# Patient Record
Sex: Female | Born: 1945
Health system: Southern US, Community
[De-identification: ages and names within clinical notes are randomized; demographics above are authoritative.]

## PROBLEM LIST (undated history)

## (undated) DIAGNOSIS — Z460 Encounter for fitting and adjustment of spectacles and contact lenses: Secondary | ICD-10-CM

## (undated) DIAGNOSIS — M549 Dorsalgia, unspecified: Secondary | ICD-10-CM

## (undated) DIAGNOSIS — M4310 Spondylolisthesis, site unspecified: Secondary | ICD-10-CM

## (undated) DIAGNOSIS — M8580 Other specified disorders of bone density and structure, unspecified site: Secondary | ICD-10-CM

## (undated) DIAGNOSIS — S32402S Unspecified fracture of left acetabulum, sequela: Secondary | ICD-10-CM

## (undated) DIAGNOSIS — I4510 Unspecified right bundle-branch block: Secondary | ICD-10-CM

## (undated) HISTORY — DX: Dorsalgia, unspecified: M54.9

## (undated) HISTORY — DX: Unspecified fracture of left acetabulum, sequela: S32.402S

## (undated) HISTORY — PX: COLONOSCOPY: SHX174

## (undated) HISTORY — DX: Other specified disorders of bone density and structure, unspecified site: M85.80

## (undated) HISTORY — DX: Spondylolisthesis, site unspecified: M43.10

## (undated) HISTORY — PX: HAND TENDON SURGERY: SHX663

---

## 1996-03-14 HISTORY — PX: PLANTAR FASCIA SURGERY: SHX746

## 1998-02-11 ENCOUNTER — Other Ambulatory Visit: Admission: RE | Admit: 1998-02-11 | Discharge: 1998-02-11 | Payer: Self-pay | Admitting: Gynecology

## 1999-02-23 ENCOUNTER — Other Ambulatory Visit: Admission: RE | Admit: 1999-02-23 | Discharge: 1999-02-23 | Payer: Self-pay | Admitting: Gynecology

## 2000-02-24 ENCOUNTER — Other Ambulatory Visit: Admission: RE | Admit: 2000-02-24 | Discharge: 2000-02-24 | Payer: Self-pay | Admitting: Gynecology

## 2001-02-28 ENCOUNTER — Other Ambulatory Visit: Admission: RE | Admit: 2001-02-28 | Discharge: 2001-02-28 | Payer: Self-pay | Admitting: Gynecology

## 2002-02-28 ENCOUNTER — Other Ambulatory Visit: Admission: RE | Admit: 2002-02-28 | Discharge: 2002-02-28 | Payer: Self-pay | Admitting: Gynecology

## 2003-03-20 ENCOUNTER — Other Ambulatory Visit: Admission: RE | Admit: 2003-03-20 | Discharge: 2003-03-20 | Payer: Self-pay | Admitting: Gynecology

## 2004-03-14 HISTORY — PX: OTHER SURGICAL HISTORY: SHX169

## 2004-03-24 ENCOUNTER — Other Ambulatory Visit: Admission: RE | Admit: 2004-03-24 | Discharge: 2004-03-24 | Payer: Self-pay | Admitting: Gynecology

## 2005-03-30 ENCOUNTER — Other Ambulatory Visit: Admission: RE | Admit: 2005-03-30 | Discharge: 2005-03-30 | Payer: Self-pay | Admitting: Gynecology

## 2006-06-15 ENCOUNTER — Other Ambulatory Visit: Admission: RE | Admit: 2006-06-15 | Discharge: 2006-06-15 | Payer: Self-pay | Admitting: Gynecology

## 2007-06-21 ENCOUNTER — Other Ambulatory Visit: Admission: RE | Admit: 2007-06-21 | Discharge: 2007-06-21 | Payer: Self-pay | Admitting: Gynecology

## 2007-09-20 ENCOUNTER — Encounter: Admission: RE | Admit: 2007-09-20 | Discharge: 2007-09-20 | Payer: Self-pay | Admitting: Internal Medicine

## 2008-07-03 ENCOUNTER — Other Ambulatory Visit: Admission: RE | Admit: 2008-07-03 | Discharge: 2008-07-03 | Payer: Self-pay | Admitting: Gynecology

## 2008-07-03 ENCOUNTER — Ambulatory Visit: Payer: Self-pay | Admitting: Women's Health

## 2008-07-03 ENCOUNTER — Encounter: Payer: Self-pay | Admitting: Women's Health

## 2008-10-07 ENCOUNTER — Ambulatory Visit: Payer: Self-pay | Admitting: Women's Health

## 2009-07-09 ENCOUNTER — Other Ambulatory Visit: Admission: RE | Admit: 2009-07-09 | Discharge: 2009-07-09 | Payer: Self-pay | Admitting: Gynecology

## 2009-07-09 ENCOUNTER — Ambulatory Visit: Payer: Self-pay | Admitting: Women's Health

## 2009-07-21 ENCOUNTER — Ambulatory Visit: Payer: Self-pay | Admitting: Gynecology

## 2010-03-14 HISTORY — PX: BACK SURGERY: SHX140

## 2010-07-19 ENCOUNTER — Encounter (INDEPENDENT_AMBULATORY_CARE_PROVIDER_SITE_OTHER): Payer: BC Managed Care – PPO | Admitting: Women's Health

## 2010-07-19 DIAGNOSIS — L940 Localized scleroderma [morphea]: Secondary | ICD-10-CM

## 2010-07-19 DIAGNOSIS — N952 Postmenopausal atrophic vaginitis: Secondary | ICD-10-CM

## 2010-07-19 DIAGNOSIS — N951 Menopausal and female climacteric states: Secondary | ICD-10-CM

## 2010-10-14 ENCOUNTER — Other Ambulatory Visit (HOSPITAL_COMMUNITY): Payer: Self-pay | Admitting: Orthopedic Surgery

## 2010-10-14 ENCOUNTER — Ambulatory Visit (HOSPITAL_COMMUNITY)
Admission: RE | Admit: 2010-10-14 | Discharge: 2010-10-14 | Disposition: A | Discharge status: Home / Self Care | Payer: BC Managed Care – PPO | Source: Ambulatory Visit | Attending: Orthopedic Surgery | Admitting: Orthopedic Surgery

## 2010-10-14 ENCOUNTER — Encounter (HOSPITAL_COMMUNITY)
Admission: RE | Admit: 2010-10-14 | Discharge: 2010-10-14 | Disposition: A | Discharge status: Home / Self Care | Payer: BC Managed Care – PPO | Source: Ambulatory Visit | Attending: Orthopedic Surgery | Admitting: Orthopedic Surgery

## 2010-10-14 DIAGNOSIS — M47817 Spondylosis without myelopathy or radiculopathy, lumbosacral region: Secondary | ICD-10-CM | POA: Insufficient documentation

## 2010-10-14 DIAGNOSIS — M4310 Spondylolisthesis, site unspecified: Secondary | ICD-10-CM

## 2010-10-14 DIAGNOSIS — Q762 Congenital spondylolisthesis: Secondary | ICD-10-CM | POA: Insufficient documentation

## 2010-10-14 DIAGNOSIS — Z01812 Encounter for preprocedural laboratory examination: Secondary | ICD-10-CM | POA: Insufficient documentation

## 2010-10-14 DIAGNOSIS — Z01818 Encounter for other preprocedural examination: Secondary | ICD-10-CM | POA: Insufficient documentation

## 2010-10-14 LAB — CBC
MCH: 30.9 pg (ref 26.0–34.0)
Platelets: 224 10*3/uL (ref 150–400)
RBC: 4.24 MIL/uL (ref 3.87–5.11)
WBC: 4.3 10*3/uL (ref 4.0–10.5)

## 2010-10-14 LAB — SURGICAL PCR SCREEN: Staphylococcus aureus: NEGATIVE

## 2010-10-14 LAB — BASIC METABOLIC PANEL
Calcium: 10.4 mg/dL (ref 8.4–10.5)
GFR calc non Af Amer: >60 mL/min (ref >60)
Glucose, Bld: 104 mg/dL — ABNORMAL HIGH (ref 70–99)
Sodium: 141 mEq/L (ref 135–145)

## 2010-10-14 LAB — TYPE AND SCREEN: ABO/RH(D): A POS

## 2010-10-21 ENCOUNTER — Inpatient Hospital Stay (HOSPITAL_COMMUNITY): Payer: BC Managed Care – PPO

## 2010-10-21 ENCOUNTER — Inpatient Hospital Stay (HOSPITAL_COMMUNITY)
Admission: RE | Admit: 2010-10-21 | Discharge: 2010-10-24 | DRG: 756 | Disposition: A | Discharge status: Home / Self Care | Payer: BC Managed Care – PPO | Source: Ambulatory Visit | Attending: Orthopedic Surgery | Admitting: Orthopedic Surgery

## 2010-10-21 DIAGNOSIS — M4310 Spondylolisthesis, site unspecified: Principal | ICD-10-CM | POA: Diagnosis present

## 2010-10-21 DIAGNOSIS — M48061 Spinal stenosis, lumbar region without neurogenic claudication: Secondary | ICD-10-CM | POA: Diagnosis present

## 2010-10-22 ENCOUNTER — Inpatient Hospital Stay (HOSPITAL_COMMUNITY): Payer: BC Managed Care – PPO

## 2010-10-23 NOTE — Op Note (Signed)
NAMENINI, Lauren Francis NO.:  [id number]  MEDICAL RECORD NO.:  [id number]  LOCATION:  5015                         FACILITY:  [facility]  PHYSICIAN:  Alvy Beal, MD    DATE OF BIRTH:  11/23/1945  DATE OF PROCEDURE:  10/21/2010 DATE OF DISCHARGE:                              OPERATIVE REPORT   PREOPERATIVE DIAGNOSES:  Degenerative spondylolisthesis, significant stenosis L3-4.  POSTOPERATIVE DIAGNOSIS:  Degenerative spondylolisthesis, significant stenosis L3-4.  OPERATIVE PROCEDURE:  Anterolateral diskectomy and interbody fusion with implantation of biomechanical device, and the system used was NuVasive size 12 lordotic x 18 cage 55 mm in length with supplemental posterior instrumentation L3-4 with Matrix Synthes pedicle screws and posterolateral autograft fusion.  FIRST ASSISTANT:  Norval Gable, PA  COMPLICATIONS:  None.  CONDITION:  Stable.  ESTIMATED BLOOD LOSS:  Minimal blood loss.  HISTORY:  This is a very pleasant 65 year old with degenerative scoliosis with significant spinal degenerative spondylolisthesis at L3- 4.  Attempts at conservative management have failed to alleviate her symptoms, and she continued to have significant disabling pain.  After discussing treatment options, we elected to proceed with surgery.  All appropriate risks, benefits, and alternatives were discussed with the patient.  Consent was obtained.  OPERATIVE NOTE:  The patient was brought to the operating room, placed supine on the operating table.  After successful induction of general anesthesia and endotracheal intubation, TEDs, SCDs, and Foley were inserted, lower extremity EMG needles were placed for neuro monitoring. She was then placed into the left lateral decubitus position.  Axillary roll was placed.  All bony prominences well padded, and she was securely taped to the bed.  The bed was broken, and the x-ray was brought in and we identified the L3-4 level.  Once  we had good visualization of the endplates of the AP and lateral planes, the lateral aspect of the back was prepped, draped in standard fashion.  Appropriate time-out was then done to confirm patient procedure and all other pertinent important data.  Once this was done, a lateral incision was made centered over the L3-4 disk.  Sharp dissection was carried out down to the fascia of the external oblique.  Fascia was incised.  A second small incision was made one fingerbreadth posterior to this lateral incision.  I bluntly dissected through the deep lumbar fascia to the fascia of the retroperitoneal space.  I popped through this space, and I was able to palpate the surface of the iliopsoas, the ventral surface of the transverse process, and I began mobilizing the retroperitoneal fat to better it.  Once I had mobilized this area, I then passed my finger up to the external oblique, bluntly dissected through the external oblique until I could see the fingertip.  I then advanced trocar by placing my finger down to the iliopsoas.  I then probed the psoas to identify the location of lumbar plexus and then placed my probe directly over the L3- 4 disk space.  I then advanced it through the iliopsoas.  Using the neuro monitoring, I circumferentially probed the disk space to ensure that I was not damaging the lumbar plexus.  I then sequentially dilated up and again checking neuro monitoring throughout.  Once I had the trocars advanced, I placed the working dilating system, retractor system, and secured it to the table.  X-rays confirmed satisfactory position.  I then opened it so I could clearly see the disk space.  I then used a trocar to secure it into place.  Once the retractor was secured, I began the diskectomy.  I incised the disk space using a combination of pituitary rongeurs and Kerrison rongeurs.  I removed all the disk material.  I had a very extensive diskectomy.  I rasped the endplates  until I felt and heard the subchondral bone.  Inspection revealed that I had bleeding subchondral bone.  I then sequentially trialed and elected to use a size 12 55 lordotic cages.  It gave excellent correction in both the coronal and sagittal planes.  I obtained a PEEK spacer, packed it with a combination of DBX and Osteocel.  Once it was packed, I malleted it down to the final position, which was very satisfactory.  I then removed the inserting device and gently began removing the retractor all the while evaluating the iliopsoas for hemorrhage.  I obtained hemostasis, irrigated the wound copiously with normal saline, and then closed the fascia of the external oblique with #1 Vicryl suture, superficial 2-0 Vicryl suture, and 3-0 Monocryl for the skin.  The second stab incision that I had made I irrigated out, closed the deep fascia with a 0 Vicryl, superficial with 2-0 Vicryl suture, and skin with 3-0 Monocryl.  At this point with the interbody fixation complete, I then returned the bed to the neutral position and with the patient still in the lateral position, made a standard Wilsey approach to the left side of the pedicle.  I made an incision, dissected bluntly down, so I could feel the facet complex and transverse process.  I then placed a Jamshidi needle on the entry portion for the L4 pedicle and confirmed satisfactory position in both the AP and lateral planes, connected the Jamshidi to the neuro monitoring and using real-time fluoro and neuro monitoring, advanced the Jamshidi to the pedicle and into the vertebral body.  I repeated this at the L3 level.  I placed guide pins through the Jamshidi and then tapped over the guide pin.  Again, I did hook the tap to the neuro monitoring to ensure that I had no neurodiagnostic evidence of breach nor did I have radiographic evidence of breach.  I then placed the screws 40 mm, 6.0 diameter Synthes Matrix pedicle screws over the guide pin and  down. I then tested both screws and again, there was no evidence of breach. With the pedicle screw fixation complete, I then took a high-speed bur, placed on the lateral aspect of the 3-4 facet complex, decorticated, and then palpated the transverse process of 3-4 and decorticated them.  I then placed a 40-mm rod and locked it down.  I torqued the caps.  At this point, the posterolateral instrumentation was complete.  I then took a trocar packed with the remaining bone graft and passed it down into the posterior lateral gutter.  With the arthrodesis complete, I irrigated, closed the deep fascia with interrupted #1 Vicryl sutures, superficial with 2-0 Vicryl suture and 3-0 Monocryl for the skin.  Dry dressings were applied.  The patient was extubated, transferred to the PACU without incident.  At the end of the case, all needle and sponge counts were correct.  No adverse intraoperative events.     Alvy Beal, MD  DDB/MEDQ  D:  10/21/2010  T:  10/22/2010  Job:  [id number]  Electronically Signed by Venita Lick MD on 10/23/2010 08:01:55 PM

## 2010-10-29 ENCOUNTER — Telehealth: Payer: Self-pay

## 2010-10-29 NOTE — Telephone Encounter (Signed)
PER NANCY VERBALLY. SHE READ THIS FYI NOTE.

## 2010-10-29 NOTE — Telephone Encounter (Signed)
FYI ONLY PT. CALLED EARLIER TODAY BECAUSE SHE WAS HAVING TROUBLE GETTING TOUCH WITH HER PCP DR. WHITE FOR HEMORRHOIDS. HAD BACK SURGERY ABOUT A WEEK AGO. SINCE I CALLED HER BACK HER PCP TOLD HER TO COME IN TODAY. SINCE SHE IS DOING REALLY WELL WITH BACK SURGERY SHE WILL BE ABLE TO SEE THEM THIS P.M. TO GET HELP.

## 2010-11-01 ENCOUNTER — Encounter (INDEPENDENT_AMBULATORY_CARE_PROVIDER_SITE_OTHER): Payer: Self-pay

## 2010-11-01 ENCOUNTER — Ambulatory Visit (INDEPENDENT_AMBULATORY_CARE_PROVIDER_SITE_OTHER): Payer: BC Managed Care – PPO | Admitting: Surgery

## 2010-11-01 VITALS — BP 140/88 | HR 78 | Temp 97.8°F | Ht 62.5 in | Wt 140.6 lb

## 2010-11-01 DIAGNOSIS — K648 Other hemorrhoids: Secondary | ICD-10-CM | POA: Insufficient documentation

## 2010-11-01 NOTE — Patient Instructions (Addendum)
See Hemorrhoid / THD Handouts.  Consider surgery to treat Hemorrhoids.   Stop antibiotic cream & pills

## 2010-11-01 NOTE — Progress Notes (Signed)
Subjective:     Patient ID: Lauren Francis, female   DOB: [birth year], 65 y.o.   MRN: [id number]  HPI  Reason for visit: Thrombosed hemorrhoid status post incision. Consideration of more definitive hemorrhoid surgery.  Patient is a pleasant female with a forty year history of hemorrhoids. Patient claims they have been mild & well controlled with soft bowel movements. It started when she was pregnant with her children. She normally has a bowel movement every other day to every day.  She recently had back surgery. She became constipated. She then had a severe anal pain with a hard BM. She came to her primary care physician. They performed an incision and drainage of a thrombosed hemorrhoid. She comes today for followup to consider more definitive surgery. She has had a colonoscopy that was normal.    Review of Systems  Constitutional: Negative for fever, chills, diaphoresis, appetite change and fatigue.  HENT: Negative for ear pain, sore throat, trouble swallowing, neck pain and ear discharge.   Eyes: Negative for photophobia, discharge and visual disturbance.  Respiratory: Negative for cough, choking, chest tightness and shortness of breath.   Cardiovascular: Negative for chest pain and palpitations.  Gastrointestinal: Positive for anal bleeding and rectal pain. Negative for nausea, vomiting, abdominal pain, diarrhea, constipation and blood in stool.  Genitourinary: Negative for dysuria, frequency and difficulty urinating.  Musculoskeletal: Negative for myalgias and gait problem.  Skin: Negative for color change, pallor and rash.  Neurological: Negative for dizziness, speech difficulty, weakness and numbness.  Hematological: Negative for adenopathy.  Psychiatric/Behavioral: Negative for confusion and agitation. The patient is not nervous/anxious.        Objective:   Physical Exam  Constitutional: She is oriented to person, place, and time. She appears well-developed and  well-nourished. No distress.  HENT:  Head: Normocephalic.  Mouth/Throat: Oropharynx is clear and moist. No oropharyngeal exudate.  Eyes: Conjunctivae and EOM are normal. Pupils are equal, round, and reactive to light. No scleral icterus.  Neck: Normal range of motion. Neck supple. No tracheal deviation present.  Cardiovascular: Normal rate, regular rhythm and intact distal pulses.   Pulmonary/Chest: Effort normal and breath sounds normal. No respiratory distress. She exhibits no tenderness.  Abdominal: Soft. She exhibits no distension and no mass. There is no tenderness. Hernia confirmed negative in the right inguinal area and confirmed negative in the left inguinal area.       Low midline incision well-healed  Genitourinary: Vagina normal. No vaginal discharge found.       Large 3 pile int/ext prolapsed hemorrhoids.  Inner hem tags necrosed.  NST.  No rectal masses.  No fissure/fistula  Musculoskeletal: Normal range of motion. She exhibits no tenderness.  Lymphadenopathy:    She has no cervical adenopathy.       Right: No inguinal adenopathy present.       Left: No inguinal adenopathy present.  Neurological: She is alert and oriented to person, place, and time. No cranial nerve deficit. She exhibits normal muscle tone. Coordination normal.  Skin: Skin is warm and dry. No rash noted. She is not diaphoretic. No erythema.  Psychiatric: She has a normal mood and affect. Her behavior is normal. Judgment and thought content normal.       Assessment:     Large 3-pile hemorrhoids with recent thrombosis & some necrotic tags.  Better post I&D of thrombosed hemorrhoids    Plan:     These are large and chronically prolapsed. I think she would benefit from surgery  to treat them. I do not think it is mandated she truly has been asymptomatic until a recent episode of thrombosis. Even though they are impresses with some central necrosis, she feels much better overall.  If inflammation calms down, she  may be a candidate for THD. However, she may require more standard hemorrhoidectomy. I will have to tailor this since I would prefer avoiding in removing all 3 piles at once to minimize stenosis/scarring  Sh is leaning toward surgery at some point. However she agreed to allow the inflammation to calm down over the next 4-6 weeks before considering this. I gave her my card. I gave her Handouts. She will think about things and let me know if she wishes to proceed.  The anatomy & physiology of the anorectal region was discussed.  The pathophysiology of hemorrhoids and differential diagnosis was discussed.  Natural history risks without surgery was discussed.   I stressed the importance of a bowel regimen to have daily soft bowel movements to minimize progression of disease.  Interventions such as sclerotherapy & banding were discussed.  The patient's symptoms are not adequately controlled by medicines and other non-operative treatments.  I feel the risks & problems of no surgery outweigh the operative risks; therefore, I recommended surgery to treat the hemorrhoids by ligation, pexy, and possible resection.  Risks such as bleeding, infection, need for further treatment, heart attack, death, and other risks were discussed.  Goals of post-operative recovery were discussed as well.  Possibility that this will not correct all symptoms was explained.  Post-operative pain, bleeding, constipation, and other problems after surgery were discussed.  We will work to minimize complications.   Educational handouts further explaining the pathology, treatment options, and bowel regimen were given as well.  Questions were answered.  The patient expresses understanding & wishes to proceed with surgery.

## 2010-11-12 NOTE — Discharge Summary (Signed)
[name], [name] NO.:  [id number]  MEDICAL RECORD NO.:  [id number]  LOCATION:                                 FACILITY:  PHYSICIAN:  Lauren Beal, MD    DATE OF BIRTH:  1945-07-31  DATE OF ADMISSION:  10/21/2010 DATE OF DISCHARGE:  10/24/2010                              DISCHARGE SUMMARY   ADMITTING DIAGNOSIS:  Degenerative spondylolisthesis with significant stenosis at L3-4.  DISCHARGE DIAGNOSIS:  Degenerative spondylolisthesis with significant stenosis at L3-4.  SURGEON:  Lauren Beal, MD  FIRST ASSISTANT:  Lauren Gable, PA-C  PROCEDURE:  Anterolateral diskectomy and interbody fusion with implantation of biomechanical device with supplemental posterior instrumentation at L3-4 with pedicle screws and posterior lateral autograft fusion.  ANESTHESIA:  General.  COMPLICATIONS:  None.  CONSULTS:  None.  HISTORY:  Lauren Francis is a 65 year old female with degenerative scoliosis with significant spinal degenerative spondylolisthesis at L3-4 with significant back, buttock and lower extremity pain.  Unfortunately, her attempts at physical therapy, narcotic and nonnarcotic medication therapy, activity modification, injection therapy failed to provide her with any significant relief.  MRI of the lumbar spine dated Aug 10, 2010, demonstrated: 1. Transitional vertebral anatomy at the lumbosacral junction. 2. Tiny faint annular disk tear at L2-3. 3. A grade 1 anterolisthesis at L3-4 related to severe facet     arthrosis.  Moderate central canal, moderate to severe lateral     recess, moderate to severe right foraminal and moderate left     foraminal stenosis at L3-4.  Tiny annular disk tear with mild     foraminal stenosis at L4-5.  Chomping of the nerve root at L2 and     L3 level likely related to L3-4 central canal stenosis or less     likely arachnoiditis.  Because of the ongoing severe disabling, back buttock and leg pain, the patient  elected to proceed with surgery.  HOSPITAL COURSE:  On October 21, 2010, the patient was admitted to [facility] and underwent the above procedures without complications. The patient's condition following the procedures were stable.  She was transferred to the PACU and subsequently to the orthopedic floor.  On postop day 1, the patient was doing well with no major complaints. She is afebrile.  Her vital signs were stable.  Her neurovascular status was intact.  There was 2+ dorsalis pedis and posterior tibial pulses. There is no shortness of breath or chest pain.  The abdomen was soft and nontender.  The plan at that time was to continue with care.  She would be mobile with physical therapy and occupational therapy.  We would advance her diet as tolerated.  She was also receive x-rays later that day.  On postop day 2, the patient was doing well.  Her pain was well controlled with oral pain medications.  She reports being out of bed with physical therapy, voiding independently, however, she had not yet had a bowel movement.  Her dressings were changed.  The incision was clean, dry and intact.  Her neurovascular status in bilateral lower extremities was intact.  She was continued to get daily dressing changes.  Continue with physical therapy and occupational therapy until cleared.  On postop day 3, the patient complained of only mild pain.  She was afebrile.  She was voiding on her own.  Tolerating a regular diet.  Her pain was adequately controlled with oral pain medications and she had been cleared by physical therapy and occupational therapy and would be discharged to home.  DISCHARGE MEDICATIONS:  The patient would continue her home medications which include: 1. Ibuprofen 200 mg. 2. Omega red 300 mg. 3. Enteric-coated aspirin 81 mg. 4. Vitamin D 3000 units. 5. Calcium carbonate/vitamin D over-the-counter. 6. Multivitamins. 7. Atorvastatin 20 mg. 8. Synthroid 100  mcg.  NEW MEDICATION AT THE TIME OF DISCHARGE:  Percocet and Robaxin.  PLAN AT THE TIME OF DISCHARGE:  The patient would be discharged to home with the assistance of a Home Health Service on October 24, 2010.  DISCHARGE DIAGNOSIS:  Please see above.  DISCHARGE INSTRUCTIONS:  Were given to the patient at the time of discharge.  She understands.  INSTRUCTIONS:  Regular diet.  ACTIVITY:  Per protocol and to follow up in our office in 2 weeks for suture removal, wound check and further discussion of the expected recovery course.  All of the patient's questions were encouraged, addressed and answered. They know to contact our office if problems arise before her scheduled postop visit.  CONDITION AT THE TIME OF DISCHARGE:  Considered stable.    ______________________________ Lauren Gable, PA   ______________________________ Lauren Beal, MD    DK/MEDQ  D:  11/04/2010  T:  11/05/2010  Job:  [id number]  Electronically Signed by Lauren Gable PA on 11/11/2010 09:34:00 AM Electronically Signed by Venita Lick MD on 11/12/2010 03:50:16 PM

## 2010-11-29 ENCOUNTER — Encounter: Payer: Self-pay | Admitting: Women's Health

## 2011-05-16 ENCOUNTER — Telehealth (INDEPENDENT_AMBULATORY_CARE_PROVIDER_SITE_OTHER): Payer: Self-pay | Admitting: Surgery

## 2011-05-17 ENCOUNTER — Telehealth (INDEPENDENT_AMBULATORY_CARE_PROVIDER_SITE_OTHER): Payer: Self-pay

## 2011-05-17 NOTE — Telephone Encounter (Signed)
Returned pt's call about needing an appt with Dr Michaell Cowing for March. I was going to make an appt for 3-25 for the pt to see Dr Michaell Cowing but the pt told me she needed to see Dr Michaell Cowing in March to also have her surgery before 06-13-11. I explained to the pt that this would not happen b/c Dr Michaell Cowing has been out of the office and his schedule is full till 06-06-11. The surgery is an elective case that would not be an emergency b/c her insurance is changing. I advised the pt to call us when she is ready to schedule an appt with Dr Michaell Cowing but to give [place] some time also to be able to schedule the surgery for a day she would request. The pt understands and will call back when she meets her deductible again.

## 2011-08-01 ENCOUNTER — Encounter: Payer: BC Managed Care – PPO | Admitting: Women's Health

## 2011-08-15 ENCOUNTER — Other Ambulatory Visit (HOSPITAL_COMMUNITY)
Admission: RE | Admit: 2011-08-15 | Discharge: 2011-08-15 | Disposition: A | Discharge status: Home / Self Care | Payer: BC Managed Care – PPO | Source: Ambulatory Visit | Attending: Obstetrics and Gynecology | Admitting: Obstetrics and Gynecology

## 2011-08-15 ENCOUNTER — Encounter: Payer: Self-pay | Admitting: Women's Health

## 2011-08-15 ENCOUNTER — Ambulatory Visit (INDEPENDENT_AMBULATORY_CARE_PROVIDER_SITE_OTHER): Payer: BC Managed Care – PPO | Admitting: Women's Health

## 2011-08-15 VITALS — BP 130/80 | Ht 63.5 in | Wt 146.0 lb

## 2011-08-15 DIAGNOSIS — M949 Disorder of cartilage, unspecified: Secondary | ICD-10-CM

## 2011-08-15 DIAGNOSIS — Z124 Encounter for screening for malignant neoplasm of cervix: Secondary | ICD-10-CM | POA: Insufficient documentation

## 2011-08-15 DIAGNOSIS — F411 Generalized anxiety disorder: Secondary | ICD-10-CM

## 2011-08-15 DIAGNOSIS — E039 Hypothyroidism, unspecified: Secondary | ICD-10-CM | POA: Insufficient documentation

## 2011-08-15 DIAGNOSIS — M8580 Other specified disorders of bone density and structure, unspecified site: Secondary | ICD-10-CM | POA: Insufficient documentation

## 2011-08-15 DIAGNOSIS — Z01419 Encounter for gynecological examination (general) (routine) without abnormal findings: Secondary | ICD-10-CM

## 2011-08-15 DIAGNOSIS — E7800 Pure hypercholesterolemia, unspecified: Secondary | ICD-10-CM | POA: Insufficient documentation

## 2011-08-15 DIAGNOSIS — F419 Anxiety disorder, unspecified: Secondary | ICD-10-CM

## 2011-08-15 MED ORDER — ALPRAZOLAM 0.25 MG PO TABS: 0.2500 mg | ORAL_TABLET | ORAL | Status: DC | PRN

## 2011-08-15 NOTE — Progress Notes (Signed)
Lauren Francis 08/01/1955 [id number]    History:    The patient presents for annual exam.  Postmenopausal on no HRT with no bleeding. Hypothyroid on 100 mcg per primary care. History of osteopenia last DEXA 07/2010 T score -1.5 at right femoral neck. FRAX score for major osteoporotic fracture 15.6%, hip fracture 1.9%. Has had a normal vitamin D level. Is exercising on a regular basis. History of normal mammograms and Paps. Had back surgery for spondylosis in 2012 and is doing well.  Past medical history, past surgical history, family history and social history were all reviewed and documented in the EPIC chart. Normal colonoscopy in 2008. Zostovac and Pneumovax vaccine 2011. Working part time and plans to retire at the end of the year. Granddaughter Lauren Francis age 19 with spina bifida doing well with in and out catheters and ostomy irrigations.  A  ROS was performed and pertinent positives and negatives are included in the history.  Exam:  Filed Vitals:   08/15/11 0843  BP: 130/80    General appearance:  Normal Head/Neck:  Normal, without cervical or supraclavicular adenopathy. Thyroid:  Symmetrical, normal in size, without palpable masses or nodularity. Respiratory  Effort:  Normal  Auscultation:  Clear without wheezing or rhonchi Cardiovascular  Auscultation:  Regular rate, without rubs, murmurs or gallops  Edema/varicosities:  Not grossly evident Abdominal  Soft,nontender, without masses, guarding or rebound.  Liver/spleen:  No organomegaly noted  Hernia:  None appreciated  Skin  Inspection:  Grossly normal  Palpation:  Grossly normal Neurologic/psychiatric  Orientation:  Normal with appropriate conversation.  Mood/affect:  Normal  Genitourinary    Breasts: Examined lying and sitting.     Right: Without masses, retractions, discharge or axillary adenopathy.     Left: Without masses, retractions, discharge or axillary adenopathy.   Inguinal/mons:  Normal without inguinal  adenopathy  External genitalia:  Normal  BUS/Urethra/Skene's glands:  Normal  Bladder:  Normal  Vagina:  Normal  Cervix:  Normal  Uterus:   normal in size, shape and contour.  Midline and mobile  Adnexa/parametria:     Rt: Without masses or tenderness.   Lt: Without masses or tenderness.  Anus and perineum: Normal  Digital rectal exam: Normal sphincter tone with hemorrhoids  Assessment/Plan:  66 y.o. MWF G2P2 for annual exam with no complaints.  Normal postmenopausal exam Osteopenia T score -1.5 femoral neck-5/12 Hypothyroid, hypercholesteremia-primary care/Dr. Cliffton Asters- labs and   Plan: Pap, New screening recommendations reviewed, normal Pap in 2011, reviewed if normal will need no further testing. SBE's, continue annual mammogram, calcium rich diet, vitamin D 2000 daily and continue regular exercise encouraged. Home safety and fall prevention reviewed. Home Hemoccult cards given. Prescription for Xanax 0.25 to use when necessary #30 per request.  Is aware to use sparingly, aware it is addictive.    Harrington Challenger [facility], 12:55 PM 08/15/2011

## 2011-08-15 NOTE — Patient Instructions (Signed)

## 2011-09-13 ENCOUNTER — Other Ambulatory Visit: Payer: Self-pay | Admitting: *Deleted

## 2011-09-13 DIAGNOSIS — Z1211 Encounter for screening for malignant neoplasm of colon: Secondary | ICD-10-CM

## 2011-09-14 ENCOUNTER — Other Ambulatory Visit: Payer: Self-pay | Admitting: Women's Health

## 2011-09-14 DIAGNOSIS — Z1211 Encounter for screening for malignant neoplasm of colon: Secondary | ICD-10-CM

## 2012-05-01 ENCOUNTER — Encounter: Payer: Self-pay | Admitting: Gynecology

## 2012-09-11 ENCOUNTER — Other Ambulatory Visit: Payer: Self-pay | Admitting: Family Medicine

## 2012-09-11 DIAGNOSIS — R0989 Other specified symptoms and signs involving the circulatory and respiratory systems: Secondary | ICD-10-CM

## 2012-09-25 ENCOUNTER — Ambulatory Visit
Admission: RE | Admit: 2012-09-25 | Discharge: 2012-09-25 | Disposition: A | Discharge status: Home / Self Care | Payer: Medicare Other | Source: Ambulatory Visit | Attending: Family Medicine | Admitting: Family Medicine

## 2012-09-25 DIAGNOSIS — R0989 Other specified symptoms and signs involving the circulatory and respiratory systems: Secondary | ICD-10-CM

## 2012-09-27 ENCOUNTER — Ambulatory Visit (INDEPENDENT_AMBULATORY_CARE_PROVIDER_SITE_OTHER): Payer: Medicare Other | Admitting: Women's Health

## 2012-09-27 ENCOUNTER — Encounter: Payer: Self-pay | Admitting: Women's Health

## 2012-09-27 VITALS — BP 130/78 | Ht 63.0 in | Wt 144.0 lb

## 2012-09-27 DIAGNOSIS — F419 Anxiety disorder, unspecified: Secondary | ICD-10-CM

## 2012-09-27 DIAGNOSIS — M899 Disorder of bone, unspecified: Secondary | ICD-10-CM

## 2012-09-27 DIAGNOSIS — M8580 Other specified disorders of bone density and structure, unspecified site: Secondary | ICD-10-CM

## 2012-09-27 DIAGNOSIS — F411 Generalized anxiety disorder: Secondary | ICD-10-CM

## 2012-09-27 MED ORDER — ALPRAZOLAM 0.25 MG PO TABS: 0.2500 mg | ORAL_TABLET | ORAL | Status: DC | PRN

## 2012-09-27 NOTE — Progress Notes (Signed)
Lauren Francis 1945/07/02 [id number]    History:    The patient presents for Breast and pelvic exam. Postmenopausal on no HRT with no bleeding. Hypothyroid/hypercholesterolemia-primary care manages labs and meds. Osteopenic, DEXA 2012 T score -1.5 at right femoral neck, FRAX 15.6%/1.9%. Normal Pap and mammogram history.  Negative colonoscopy 2008. Has had Pneumovax and zostavac vaccines.  Past medical history, past surgical history, family history and social history were all reviewed and documented in the EPIC chart. Retired this past year. Had a spinal fusion years ago. but has been good relief of back pain. Lauren Francis 8 doing well with spina bifida, self catheter and colostomy.   ROS:  A  ROS was performed and pertinent positives and negatives are included in the history.  Exam:  Filed Vitals:   09/27/12 0907  BP: 130/78    General appearance:  Normal Head/Neck:  Normal, without cervical or supraclavicular adenopathy. Thyroid:  Symmetrical, normal in size, without palpable masses or nodularity. Respiratory  Effort:  Normal  Auscultation:  Clear without wheezing or rhonchi Cardiovascular  Auscultation:  Regular rate, without rubs, murmurs or gallops  Edema/varicosities:  Not grossly evident Abdominal  Soft,nontender, without masses, guarding or rebound.  Liver/spleen:  No organomegaly noted  Hernia:  None appreciated  Skin  Inspection:  Grossly normal  Palpation:  Grossly normal Neurologic/psychiatric  Orientation:  Normal with appropriate conversation.  Mood/affect:  Normal  Genitourinary    Breasts: Examined lying and sitting.     Right: Without masses, retractions, discharge or axillary adenopathy.     Left: Without masses, retractions, discharge or axillary adenopathy.   Inguinal/mons:  Normal without inguinal adenopathy  External genitalia:  Normal  BUS/Urethra/Skene's glands:  Normal  Bladder:  Normal  Vagina:  Normal  Cervix:  Normal  Uterus: normal in size,  shape and contour.  Midline and mobile  Adnexa/parametria:     Rt: Without masses or tenderness.   Lt: Without masses or tenderness.  Anus and perineum: hemorrhoids  Digital rectal exam: Normal sphincter tone without palpated masses or tenderness  Assessment/Plan:  67 y.o.M. WF G2 P2 for breast and pelvic exam.  Osteopenia Hypothyroid/hypercholesterolemia-primary care labs and meds  Plan: SBE's, continue annual mammogram, calcium rich diet, vitamin D 2000 daily, continue regular exercise encouraged. DEXA, will schedule. Home safety, fall prevention and importance of regular exercise discussed. Pap normal 2013, new screening guidelines reviewed. Home Hemoccult card given with instructions.      Lauren Francis Lauren Francis, 10:47 AM 09/27/2012

## 2012-09-27 NOTE — Patient Instructions (Signed)
Health Recommendations for Postmenopausal Women Respected and ongoing research has looked at the most common causes of death, disability, and poor quality of life in postmenopausal women. The causes include heart disease, diseases of blood vessels, diabetes, depression, cancer, and bone loss (osteoporosis). Many things can be done to help lower the chances of developing these and other common problems: CARDIOVASCULAR DISEASE Heart Disease: A heart attack is a medical emergency. Know the signs and symptoms of a heart attack. Below are things women can do to reduce their risk for heart disease.   Do not smoke. If you smoke, quit.  Aim for a healthy weight. Being overweight causes many preventable deaths. Eat a healthy and balanced diet and drink an adequate amount of liquids.  Get moving. Make a commitment to be more physically active. Aim for 30 minutes of activity on most, if not all days of the week.  Eat for heart health. Choose a diet that is low in saturated fat and cholesterol and eliminate trans fat. Include whole grains, vegetables, and fruits. Read and understand the labels on food containers before buying.  Know your numbers. Ask your caregiver to check your blood pressure, cholesterol (total, HDL, LDL, triglycerides) and blood glucose. Work with your caregiver on improving your entire clinical picture.  High blood pressure. Limit or stop your table salt intake (try salt substitute and food seasonings). Avoid salty foods and drinks. Read labels on food containers before buying. Eating well and exercising can help control high blood pressure. STROKE  Stroke is a medical emergency. Stroke may be the result of a blood clot in a blood vessel in the brain or by a brain hemorrhage (bleeding). Know the signs and symptoms of a stroke. To lower the risk of developing a stroke:  Avoid fatty foods.  Quit smoking.  Control your diabetes, blood pressure, and irregular heart rate. THROMBOPHLEBITIS  (BLOOD CLOT) OF THE LEG  Becoming overweight and leading a stationary lifestyle may also contribute to developing blood clots. Controlling your diet and exercising will help lower the risk of developing blood clots. CANCER SCREENING  Breast Cancer: Take steps to reduce your risk of breast cancer.  You should practice "breast self-awareness." This means understanding the normal appearance and feel of your breasts and should include breast self-examination. Any changes detected, no matter how small, should be reported to your caregiver.  After age 40, you should have a clinical breast exam (CBE) every year.  Starting at age 40, you should consider having a mammogram (breast X-ray) every year.  If you have a family history of breast cancer, talk to your caregiver about genetic screening.  If you are at high risk for breast cancer, talk to your caregiver about having an MRI and a mammogram every year.  Intestinal or Stomach Cancer: Tests to consider are a rectal exam, fecal occult blood, sigmoidoscopy, and colonoscopy. Women who are high risk may need to be screened at an earlier age and more often.  Cervical Cancer:  Beginning at age 30, you should have a Pap test every 3 years as long as the past 3 Pap tests have been normal.  If you have had past treatment for cervical cancer or a condition that could lead to cancer, you need Pap tests and screening for cancer for at least 20 years after your treatment.  If you had a hysterectomy for a problem that was not cancer or a condition that could lead to cancer, then you no longer need Pap tests.    If you are between ages 65 and 70, and you have had normal Pap tests going back 10 years, you no longer need Pap tests.  If Pap tests have been discontinued, risk factors (such as a new sexual partner) need to be reassessed to determine if screening should be resumed.  Some medical problems can increase the chance of getting cervical cancer. In these  cases, your caregiver may recommend more frequent screening and Pap tests.  Uterine Cancer: If you have vaginal bleeding after reaching menopause, you should notify your caregiver.  Ovarian cancer: Other than yearly pelvic exams, there are no reliable tests available to screen for ovarian cancer at this time except for yearly pelvic exams.  Lung Cancer: Yearly chest X-rays can detect lung cancer and should be done on high risk women, such as cigarette smokers and women with chronic lung disease (emphysema).  Skin Cancer: A complete body skin exam should be done at your yearly examination. Avoid overexposure to the sun and ultraviolet light lamps. Use a strong sun block cream when in the sun. All of these things are important in lowering the risk of skin cancer. MENOPAUSE Menopause Symptoms: Hormone therapy products are effective for treating symptoms associated with menopause:  Moderate to severe hot flashes.  Night sweats.  Mood swings.  Headaches.  Tiredness.  Loss of sex drive.  Insomnia.  Other symptoms. Hormone replacement carries certain risks, especially in older women. Women who use or are thinking about using estrogen or estrogen with progestin treatments should discuss that with their caregiver. Your caregiver will help you understand the benefits and risks. The ideal dose of hormone replacement therapy is not known. The Food and Drug Administration (FDA) has concluded that hormone therapy should be used only at the lowest doses and for the shortest amount of time to reach treatment goals.  OSTEOPOROSIS Protecting Against Bone Loss and Preventing Fracture: If you use hormone therapy for prevention of bone loss (osteoporosis), the risks for bone loss must outweigh the risk of the therapy. Ask your caregiver about other medications known to be safe and effective for preventing bone loss and fractures. To guard against bone loss or fractures, the following is recommended:  If  you are less than age 50, take 1000 mg of calcium and at least 600 mg of Vitamin D per day.  If you are greater than age 50 but less than age 70, take 1200 mg of calcium and at least 600 mg of Vitamin D per day.  If you are greater than age 70, take 1200 mg of calcium and at least 800 mg of Vitamin D per day. Smoking and excessive alcohol intake increases the risk of osteoporosis. Eat foods rich in calcium and vitamin D and do weight bearing exercises several times a week as your caregiver suggests. DIABETES Diabetes Melitus: If you have Type I or Type 2 diabetes, you should keep your blood sugar under control with diet, exercise and recommended medication. Avoid too many sweets, starchy and fatty foods. Being overweight can make control more difficult. COGNITION AND MEMORY Cognition and Memory: Menopausal hormone therapy is not recommended for the prevention of cognitive disorders such as Alzheimer's disease or memory loss.  DEPRESSION  Depression may occur at any age, but is common in elderly women. The reasons may be because of physical, medical, social (loneliness), or financial problems and needs. If you are experiencing depression because of medical problems and control of symptoms, talk to your caregiver about this. Physical activity and   exercise may help with mood and sleep. Community and volunteer involvement may help your sense of value and worth. If you have depression and you feel that the problem is getting worse or becoming severe, talk to your caregiver about treatment options that are best for you. ACCIDENTS  Accidents are common and can be serious in the elderly woman. Prepare your house to prevent accidents. Eliminate throw rugs, place hand bars in the bath, shower and toilet areas. Avoid wearing high heeled shoes or walking on wet, snowy, and icy areas. Limit or stop driving if you have vision or hearing problems, or you feel you are unsteady with you movements and  reflexes. HEPATITIS C Hepatitis C is a type of viral infection affecting the liver. It is spread mainly through contact with blood from an infected person. It can be treated, but if left untreated, it can lead to severe liver damage over years. Many people who are infected do not know that the virus is in their blood. If you are a "baby-boomer", it is recommended that you have one screening test for Hepatitis C. IMMUNIZATIONS  Several immunizations are important to consider having during your senior years, including:   Tetanus, diptheria, and pertussis booster shot.  Influenza every year before the flu season begins.  Pneumonia vaccine.  Shingles vaccine.  Others as indicated based on your specific needs. Talk to your caregiver about these. Document Released: 04/22/2005 Document Revised: 02/15/2012 Document Reviewed: 12/17/2007 [employer] Patient Information 2014 [employer].  

## 2012-10-11 ENCOUNTER — Encounter: Payer: Self-pay | Admitting: Gynecology

## 2012-10-17 ENCOUNTER — Other Ambulatory Visit: Payer: Self-pay

## 2012-11-14 DIAGNOSIS — Z1211 Encounter for screening for malignant neoplasm of colon: Secondary | ICD-10-CM

## 2012-11-15 ENCOUNTER — Other Ambulatory Visit: Payer: Medicare Other | Admitting: Anesthesiology

## 2012-11-15 DIAGNOSIS — Z1211 Encounter for screening for malignant neoplasm of colon: Secondary | ICD-10-CM

## 2012-12-21 ENCOUNTER — Other Ambulatory Visit: Payer: Self-pay | Admitting: Orthopedic Surgery

## 2012-12-24 IMAGING — CR DG LUMBAR SPINE 2-3V
2 series · 2 of 2 positions shown · non-contrast
Comparison: Intraoperative exam intraoperative exam 10/21/2010.
Preoperative examination 10/14/2010.

CLINICAL DATA: Postop pain.

LUMBAR SPINE - 2-3 VIEW

[w l-spine a.p. *]
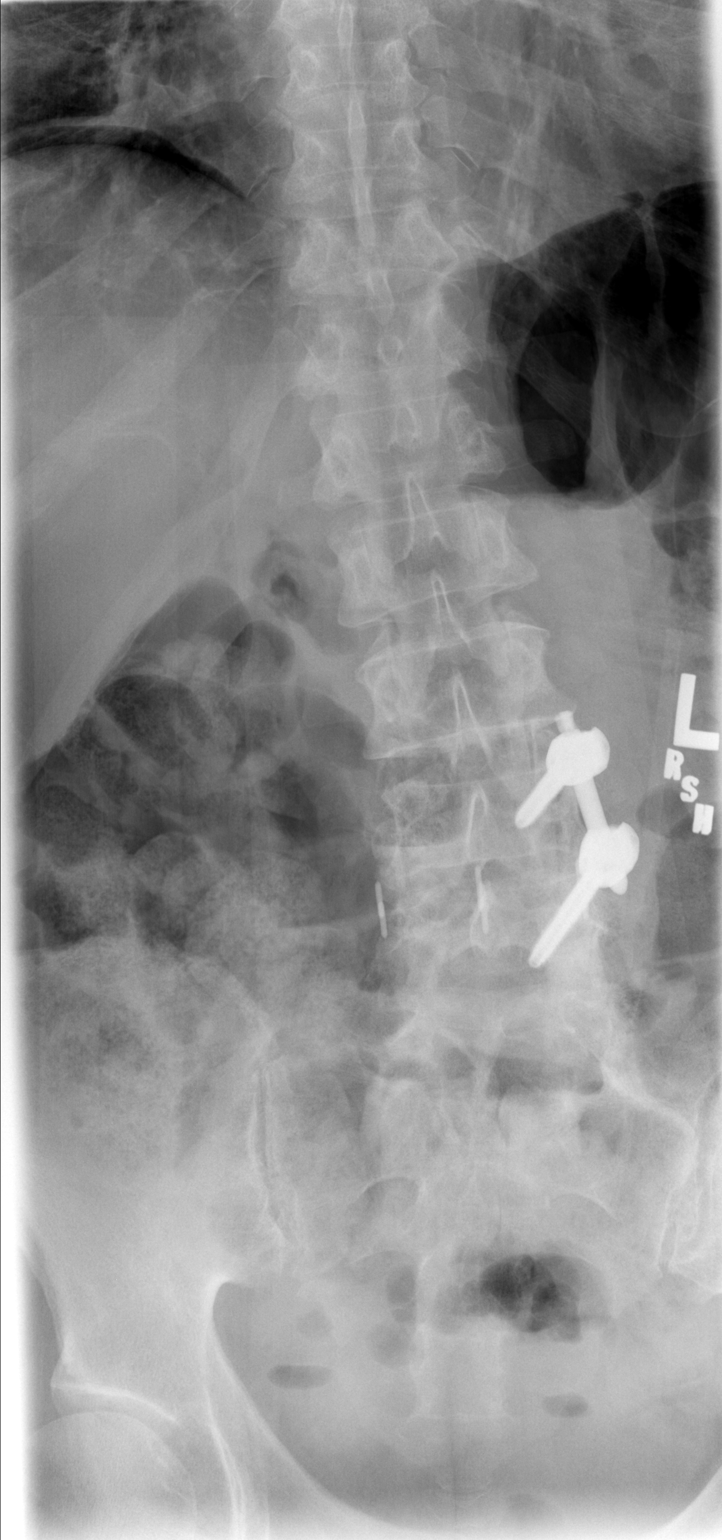

[w l-spine lat]
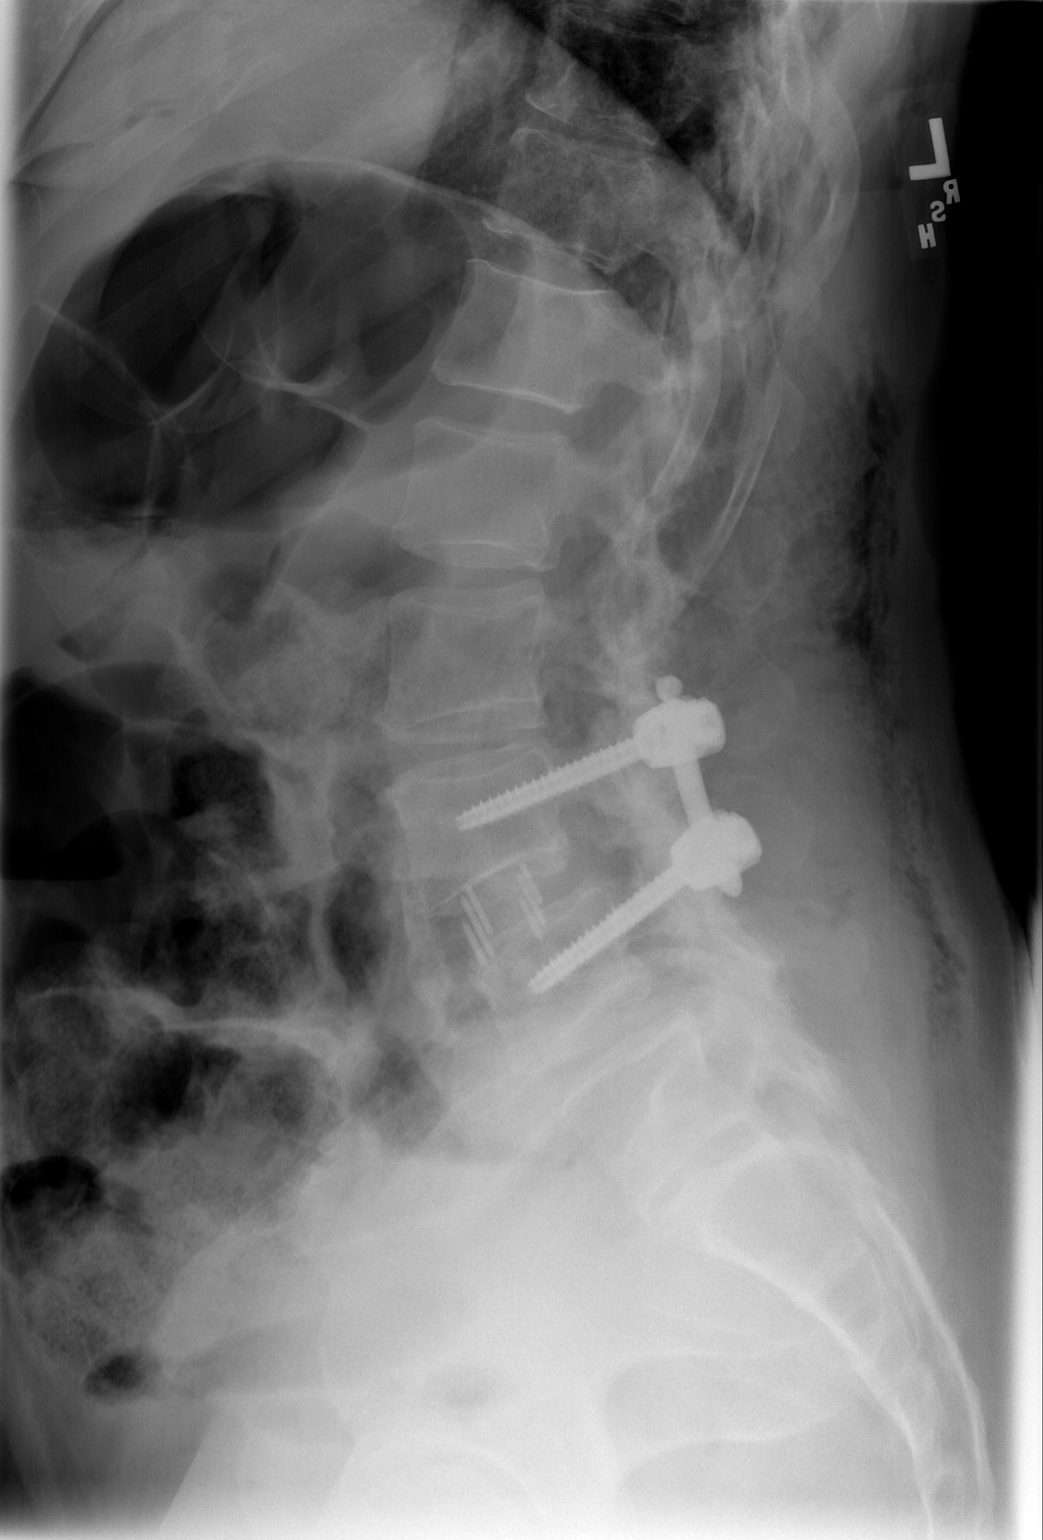

[2 of 2 positions shown; findings below may reference images not displayed]

FINDINGS: Transpedicular fusion left L3-4 level.  Interbody spacer
placement with spacer located at the right lateral aspect of the
disc space.  Mild anterior listhesis of L3 (listhesis noted
preoperatively).

Stool and gas filled prominent sized colon.

Prominent gas dorsal subcutaneous region and T12-L5.

The patient may benefit from CT imaging if symptoms persist to
evaluate for possible postoperative complication not detected by
the present plain film exam.
IMPRESSION: Fusion L3-4.  The interbody spacer is located in a right lateral
position at the outer margin of the disc space. If the patient has
persistent symptoms, CT imaging may be considered.

## 2013-01-01 ENCOUNTER — Encounter (HOSPITAL_BASED_OUTPATIENT_CLINIC_OR_DEPARTMENT_OTHER): Payer: Self-pay | Admitting: *Deleted

## 2013-01-01 NOTE — Progress Notes (Signed)
Pt had a lumb fusion 2012-had a rbbb-saw dr smith-no testing was done-he said it was not needed-did well with surgery-no labs needed

## 2013-01-04 ENCOUNTER — Encounter (HOSPITAL_BASED_OUTPATIENT_CLINIC_OR_DEPARTMENT_OTHER): Payer: Medicare Other | Admitting: Certified Registered Nurse Anesthetist

## 2013-01-04 ENCOUNTER — Encounter (HOSPITAL_BASED_OUTPATIENT_CLINIC_OR_DEPARTMENT_OTHER): Payer: Self-pay | Admitting: Certified Registered Nurse Anesthetist

## 2013-01-04 ENCOUNTER — Ambulatory Visit (HOSPITAL_BASED_OUTPATIENT_CLINIC_OR_DEPARTMENT_OTHER)
Admission: RE | Admit: 2013-01-04 | Discharge: 2013-01-04 | Disposition: A | Discharge status: Home / Self Care | Payer: Medicare Other | Source: Ambulatory Visit | Attending: Orthopedic Surgery | Admitting: Orthopedic Surgery

## 2013-01-04 ENCOUNTER — Encounter (HOSPITAL_BASED_OUTPATIENT_CLINIC_OR_DEPARTMENT_OTHER)
Admission: RE | Disposition: A | Discharge status: Home / Self Care | Payer: Self-pay | Source: Ambulatory Visit | Attending: Orthopedic Surgery

## 2013-01-04 ENCOUNTER — Ambulatory Visit (HOSPITAL_BASED_OUTPATIENT_CLINIC_OR_DEPARTMENT_OTHER): Payer: Medicare Other | Admitting: Certified Registered Nurse Anesthetist

## 2013-01-04 DIAGNOSIS — I4510 Unspecified right bundle-branch block: Secondary | ICD-10-CM | POA: Insufficient documentation

## 2013-01-04 DIAGNOSIS — Z79899 Other long term (current) drug therapy: Secondary | ICD-10-CM | POA: Insufficient documentation

## 2013-01-04 DIAGNOSIS — M899 Disorder of bone, unspecified: Secondary | ICD-10-CM | POA: Insufficient documentation

## 2013-01-04 DIAGNOSIS — G5600 Carpal tunnel syndrome, unspecified upper limb: Secondary | ICD-10-CM | POA: Insufficient documentation

## 2013-01-04 DIAGNOSIS — Z7982 Long term (current) use of aspirin: Secondary | ICD-10-CM | POA: Insufficient documentation

## 2013-01-04 DIAGNOSIS — E039 Hypothyroidism, unspecified: Secondary | ICD-10-CM | POA: Insufficient documentation

## 2013-01-04 DIAGNOSIS — E785 Hyperlipidemia, unspecified: Secondary | ICD-10-CM | POA: Insufficient documentation

## 2013-01-04 HISTORY — PX: CARPAL TUNNEL RELEASE: SHX101

## 2013-01-04 HISTORY — DX: Encounter for fitting and adjustment of spectacles and contact lenses: Z46.0

## 2013-01-04 HISTORY — DX: Unspecified right bundle-branch block: I45.10

## 2013-01-04 SURGERY — CARPAL TUNNEL RELEASE
Anesthesia: Monitor Anesthesia Care | Site: Wrist | Laterality: Right | Wound class: Clean

## 2013-01-04 MED ORDER — ONDANSETRON HCL 4 MG/2ML IJ SOLN: 4.0000 mg | Freq: Once | INTRAMUSCULAR | Status: DC | PRN

## 2013-01-04 MED ORDER — BUPIVACAINE HCL (PF) 0.25 % IJ SOLN
INTRAMUSCULAR | Status: AC
  Filled 2013-01-04: qty 30

## 2013-01-04 MED ORDER — LACTATED RINGERS IV SOLN
INTRAVENOUS | Status: DC
  Administered 2013-01-04: 12:00:00 via INTRAVENOUS

## 2013-01-04 MED ORDER — CHLORHEXIDINE GLUCONATE 4 % EX LIQD: 60.0000 mL | Freq: Once | CUTANEOUS | Status: DC

## 2013-01-04 MED ORDER — HYDROCODONE-ACETAMINOPHEN 5-325 MG PO TABS: 1.0000 | ORAL_TABLET | Freq: Four times a day (QID) | ORAL | Status: DC | PRN

## 2013-01-04 MED ORDER — ONDANSETRON HCL 4 MG/2ML IJ SOLN
INTRAMUSCULAR | Status: DC | PRN
  Administered 2013-01-04: 4 mg via INTRAVENOUS

## 2013-01-04 MED ORDER — PROPOFOL INFUSION 10 MG/ML OPTIME
INTRAVENOUS | Status: DC | PRN
  Administered 2013-01-04: 75 ug/kg/min via INTRAVENOUS

## 2013-01-04 MED ORDER — FENTANYL CITRATE 0.05 MG/ML IJ SOLN
INTRAMUSCULAR | Status: AC
  Filled 2013-01-04: qty 2

## 2013-01-04 MED ORDER — CEFAZOLIN SODIUM-DEXTROSE 2-3 GM-% IV SOLR
2.0000 g | INTRAVENOUS | Status: AC
  Administered 2013-01-04: 2 g via INTRAVENOUS

## 2013-01-04 MED ORDER — OXYCODONE HCL 5 MG/5ML PO SOLN: 5.0000 mg | Freq: Once | ORAL | Status: DC | PRN

## 2013-01-04 MED ORDER — OXYCODONE HCL 5 MG PO TABS: 5.0000 mg | ORAL_TABLET | Freq: Once | ORAL | Status: DC | PRN

## 2013-01-04 MED ORDER — CEFAZOLIN SODIUM 1-5 GM-% IV SOLN
INTRAVENOUS | Status: AC
  Filled 2013-01-04: qty 100

## 2013-01-04 MED ORDER — BUPIVACAINE HCL (PF) 0.25 % IJ SOLN
INTRAMUSCULAR | Status: DC | PRN
  Administered 2013-01-04: 6 mL

## 2013-01-04 MED ORDER — FENTANYL CITRATE 0.05 MG/ML IJ SOLN
INTRAMUSCULAR | Status: DC | PRN
  Administered 2013-01-04: 50 ug via INTRAVENOUS

## 2013-01-04 MED ORDER — LIDOCAINE HCL (CARDIAC) 20 MG/ML IV SOLN
INTRAVENOUS | Status: DC | PRN
  Administered 2013-01-04: 30 mg via INTRAVENOUS

## 2013-01-04 MED ORDER — LIDOCAINE HCL (PF) 0.5 % IJ SOLN
INTRAMUSCULAR | Status: DC | PRN
  Administered 2013-01-04: 30 mL via INTRAVENOUS

## 2013-01-04 MED ORDER — HYDROMORPHONE HCL PF 1 MG/ML IJ SOLN: 0.2500 mg | INTRAMUSCULAR | Status: DC | PRN

## 2013-01-04 SURGICAL SUPPLY — 39 items
BANDAGE GAUZE ELAST BULKY 4 IN (GAUZE/BANDAGES/DRESSINGS) ×2 IMPLANT
BLADE SURG 15 STRL LF DISP TIS (BLADE) ×1 IMPLANT
BLADE SURG 15 STRL SS (BLADE) ×2
BNDG CMPR 9X4 STRL LF SNTH (GAUZE/BANDAGES/DRESSINGS)
BNDG COHESIVE 3X5 TAN STRL LF (GAUZE/BANDAGES/DRESSINGS) ×2 IMPLANT
BNDG ESMARK 4X9 LF (GAUZE/BANDAGES/DRESSINGS) IMPLANT
CHLORAPREP W/TINT 26ML (MISCELLANEOUS) ×2 IMPLANT
CORDS BIPOLAR (ELECTRODE) ×2 IMPLANT
COVER MAYO STAND STRL (DRAPES) ×2 IMPLANT
COVER TABLE BACK 60X90 (DRAPES) ×2 IMPLANT
CUFF TOURNIQUET SINGLE 18IN (TOURNIQUET CUFF) ×2 IMPLANT
DRAPE EXTREMITY T 121X128X90 (DRAPE) ×2 IMPLANT
DRAPE SURG 17X23 STRL (DRAPES) ×2 IMPLANT
DRSG KUZMA FLUFF (GAUZE/BANDAGES/DRESSINGS) ×2 IMPLANT
GAUZE XEROFORM 1X8 LF (GAUZE/BANDAGES/DRESSINGS) ×2 IMPLANT
GLOVE BIO SURGEON STRL SZ 6.5 (GLOVE) ×1 IMPLANT
GLOVE BIOGEL PI IND STRL 7.0 (GLOVE) IMPLANT
GLOVE BIOGEL PI IND STRL 8.5 (GLOVE) ×1 IMPLANT
GLOVE BIOGEL PI INDICATOR 7.0 (GLOVE) ×1
GLOVE BIOGEL PI INDICATOR 8.5 (GLOVE) ×1
GLOVE ECLIPSE 6.5 STRL STRAW (GLOVE) ×1 IMPLANT
GLOVE SURG ORTHO 8.0 STRL STRW (GLOVE) ×2 IMPLANT
GOWN BRE IMP PREV XXLGXLNG (GOWN DISPOSABLE) ×2 IMPLANT
GOWN PREVENTION PLUS XLARGE (GOWN DISPOSABLE) ×2 IMPLANT
NEEDLE 27GAX1X1/2 (NEEDLE) ×1 IMPLANT
NS IRRIG 1000ML POUR BTL (IV SOLUTION) ×2 IMPLANT
PACK BASIN DAY SURGERY FS (CUSTOM PROCEDURE TRAY) ×2 IMPLANT
PAD CAST 3X4 CTTN HI CHSV (CAST SUPPLIES) ×1 IMPLANT
PADDING CAST ABS 4INX4YD NS (CAST SUPPLIES) ×1
PADDING CAST ABS COTTON 4X4 ST (CAST SUPPLIES) ×1 IMPLANT
PADDING CAST COTTON 3X4 STRL (CAST SUPPLIES) ×2
SPONGE GAUZE 4X4 12PLY (GAUZE/BANDAGES/DRESSINGS) ×2 IMPLANT
STOCKINETTE 4X48 STRL (DRAPES) ×2 IMPLANT
SUT VICRYL 4-0 PS2 18IN ABS (SUTURE) IMPLANT
SUT VICRYL RAPIDE 4/0 PS 2 (SUTURE) ×2 IMPLANT
SYR BULB 3OZ (MISCELLANEOUS) ×2 IMPLANT
SYR CONTROL 10ML LL (SYRINGE) ×1 IMPLANT
TOWEL OR 17X24 6PK STRL BLUE (TOWEL DISPOSABLE) ×2 IMPLANT
UNDERPAD 30X30 INCONTINENT (UNDERPADS AND DIAPERS) ×2 IMPLANT

## 2013-01-04 NOTE — Transfer of Care (Signed)
Immediate Anesthesia Transfer of Care Note  Patient: Lauren Francis  Procedure(s) Performed: Procedure(s): CARPAL TUNNEL RELEASE (Right)  Patient Location: PACU  Anesthesia Type:Bier block  Level of Consciousness: awake, alert , oriented and patient cooperative  Airway & Oxygen Therapy: Patient Spontanous Breathing and Patient connected to face mask oxygen  Post-op Assessment: Report given to PACU RN and Post -op Vital signs reviewed and stable  Post vital signs: Reviewed and stable  Complications: No apparent anesthesia complications

## 2013-01-04 NOTE — Op Note (Signed)
Dictation Number [id number]

## 2013-01-04 NOTE — Anesthesia Postprocedure Evaluation (Signed)
  Anesthesia Post-op Note  Patient: Lauren Francis  Procedure(s) Performed: Procedure(s): CARPAL TUNNEL RELEASE (Right)  Patient Location: PACU  Anesthesia Type:MAC and Bier block  Level of Consciousness: awake, alert  and oriented  Airway and Oxygen Therapy: Patient Spontanous Breathing  Post-op Pain: none  Post-op Assessment: Post-op Vital signs reviewed  Post-op Vital Signs: Reviewed  Complications: No apparent anesthesia complications

## 2013-01-04 NOTE — Op Note (Signed)
NAMEILSE, BILLMAN NO.:  [id number]  MEDICAL RECORD NO.:  [id number]  LOCATION:                                 FACILITY:  PHYSICIAN:  Cindee Salt, M.D.            DATE OF BIRTH:  DATE OF PROCEDURE:  01/04/2013 DATE OF DISCHARGE:                              OPERATIVE REPORT   PREOPERATIVE DIAGNOSIS:  Carpal tunnel syndrome, right hand.  POSTOPERATIVE DIAGNOSIS:  Carpal tunnel syndrome, right hand.  OPERATION:  Decompression, right median nerve.  SURGEON:  Cindee Salt, MD  ANESTHESIA:  Forearm-based IV regional with local infiltration.  ANESTHESIOLOGIST:  Sheldon Silvan, M.D.  HISTORY:  The patient is a 66 year old female with a history of carpal tunnel syndrome symptoms and numbness and tingling in middle ring and little fingers.  She has positive nerve conductions, has elected to undergo surgical decompression to the median nerve.  Pre, peri, and postoperative course have been discussed along with risks and complications.  She is aware that there is no guarantee with the surgery; possibility of infection; recurrence of injury to arteries, nerves, tendons, incomplete relief of symptoms, dystrophy.  In the preoperative area, the patient was seen, the extremity marked by both patient and surgeon.  Antibiotic given.  PROCEDURE IN DETAIL:  The patient was brought to the operating room where a forearm-based IV regional anesthetic was carried out without difficulty.  She was prepped using ChloraPrep, supine position with the right arm free.  A 3-minute dry time was allowed.  Time-out taken, confirming the patient and procedure.  A longitudinal incision was made in the right palm and carried down through subcutaneous tissue. Bleeders were electrocauterized.  Palmar fascia was split.  Superficial palmar arch identified.  Flexor tendon to the ring little finger identified to the ulnar side of median nerve.  The carpal retinaculum was incised with sharp  dissection.  Right angle and Sewell retractor were placed between skin and forearm fascia.  The fascia released for approximately 1.5 to 2 cm proximal to the wrist crease under direct vision.  The canal was explored.  Area of compression to the nerve was apparent.  Motor branch entered into muscle distally.  No further lesions were identified.  The wound was irrigated, and the skin was closed with 4-0 Vicryl Rapide sutures.  Local infiltration with 0.25% Marcaine without epinephrine was given, approximately 7 mL was used.  Sterile compressive dressing with fingers free was applied.  On deflation of the tourniquet, all fingers immediately pinked.  She was taken to the recovery room for observation in satisfactory condition.  She will be discharged home to return in 1 week on Vicodin.          ______________________________ Cindee Salt, M.D.     GK/MEDQ  D:  01/04/2013  T:  01/04/2013  Job:  [id number]

## 2013-01-04 NOTE — Brief Op Note (Signed)
01/04/2013  1:19 PM  PATIENT:  Lauren Francis  67 y.o. female  PRE-OPERATIVE DIAGNOSIS:  Right Carpal Tunnel Syndrome  POST-OPERATIVE DIAGNOSIS:  Right Carpal Tunnel Syndrome  PROCEDURE:  Procedure(s): CARPAL TUNNEL RELEASE (Right)  SURGEON:  Surgeon(s) and Role:    * Nicki Reaper, MD - Primary  PHYSICIAN ASSISTANT:   ASSISTANTS: none   ANESTHESIA:   local and regional  EBL:  Total I/O In: 600 [I.V.:600] Out: -   BLOOD ADMINISTERED:none  DRAINS: none   LOCAL MEDICATIONS USED:  MARCAINE     SPECIMEN:  No Specimen  DISPOSITION OF SPECIMEN:  N/A  COUNTS:  YES  TOURNIQUET:   Total Tourniquet Time Documented: Forearm (Right) - 18 minutes Total: Forearm (Right) - 18 minutes   DICTATION: .Other Dictation: Dictation Number [id number]  PLAN OF CARE: Discharge to home after PACU  PATIENT DISPOSITION:  PACU - hemodynamically stable.

## 2013-01-04 NOTE — H&P (Signed)
Lauren Francis is a 67 year old right hand dominant female who comes in complaining of right hand pain and numbness along with tingling. This has occurred for the last week but it has been going on for at least 10 years. It wakes her 2 out of 7 nights. She complains of decreased grip strength. She has taken ibuprofen which has helped. She also states it feels cold. She is right handed. She has no history of diabetes. She does have a history of thyroid problems and arthritis. She has no history of gout. There is a family history of diabetes and says she has been tested. She relates no history of injury to her hand. She states she does have a disc in her neck. She complains of intermittent, moderate throbbing pain. She states it is getting worse. Activity makes it worse and rest makes it better. She has had her nerve conductions done by Dr. Johna Roles and this reveals a motor delay of 4.5 on her right, 3.5 on her left.  Sensory delay of 2.7 right and 2.0 on the left.  Lumbrical interosseous difference is abnormal bilaterally.  She does show a diminution of amplitude to 25 on the right.  She is at 129 on the left. Injection gave her excellent relief of symptoms, but only for a short period of time.  The symptoms have recurred. She states they are as bad or worse than before the injection.    PAST MEDICAL HISTORY: She is allergic to Codeine. She is on Synthroid, Atorvastatin, calcium and vitamin D. She has had back surgery, fusion in 2012 and Hashimoto's disease with a resection of her thyroid.   FAMILY H ISTORY: Positive for diabetes, otherwise negative.  SOCIAL HISTORY: She does not smoke. She drinks socially. She is married and retired.  REVIEW OF SYSTEMS: Positive for cataracts, otherwise negative for 14 points. Lauren Francis is an 67 y.o. female.   Chief Complaint: CTS Right HPI: see above  Past Medical History  Diagnosis Date  . Hypothyroidism   . Back pain   . Spondylolisthesis   .  Hyperlipidemia   . Osteopenia   . Hemorrhoids   . Fracture acetabulum-closed, left, sequela   . RBBB   . Contact lens/glasses fitting     wears contacts or glasses    Past Surgical History  Procedure Laterality Date  . Tendonitis  2006    lt hand  . Plantar fascia surgery  1998    lt  . Back surgery  2012    lumb fusion  . Cesarean section    . Colonoscopy      Family History  Problem Relation Age of Onset  . Hypertension Mother   . Stroke Sister    Social History:  reports that she has never smoked. She has never used smokeless tobacco. She reports that she drinks alcohol. She reports that she does not use illicit drugs.  Allergies:  Allergies  Allergen Reactions  . Codeine Other (See Comments)    Night terrors   . Sulfa Antibiotics Itching    Happened about 30 years ago    Medications Prior to Admission  Medication Sig Dispense Refill  . ALPRAZolam (XANAX) 0.25 MG tablet Take 1 tablet (0.25 mg total) by mouth as needed.  30 tablet  1  . aspirin 81 MG chewable tablet Chew 81 mg by mouth daily.        . ATORVASTATIN CALCIUM PO Take 20 mg by mouth every evening.       Marland Kitchen  Cholecalciferol (VITAMIN D) 2000 UNITS tablet Take 2,000 Units by mouth daily.        Marland Kitchen levothyroxine (SYNTHROID, LEVOTHROID) 100 MCG tablet Take 100 mcg by mouth daily.       . Multiple Vitamins-Minerals (CENTRUM PO) Take by mouth.        . Calcium Carbonate (CALCIUM 600 PO) Take by mouth 2 (two) times daily.          No results found for this or any previous visit (from the past 48 hour(s)).  No results found.   Pertinent items are noted in HPI.  Blood pressure 151/92, pulse 74, temperature 98.2 F (36.8 C), temperature source Oral, resp. rate 16, height 5\' 3"  (1.6 m), weight 145 lb (65.772 kg), SpO2 100.00%.  General appearance: alert, cooperative and appears stated age Head: Normocephalic, without obvious abnormality Neck: no JVD Resp: clear to auscultation bilaterally Cardio: regular  rate and rhythm, S1, S2 normal, no murmur, click, rub or gallop GI: soft, non-tender; bowel sounds normal; no masses,  no organomegaly Extremities: extremities normal, atraumatic, no cyanosis or edema Pulses: 2+ and symmetric Skin: Skin color, texture, turgor normal. No rashes or lesions Neurologic: Grossly normal Incision/Wound: na  Assessment/Plan We have discussed release of the right carpal canal.    The pre, peri and postoperative course were discussed along with the risks and complications.  The patient is aware there is no guarantee with the surgery, possibility of infection, recurrence, injury to arteries, nerves, tendons, incomplete relief of symptoms and dystrophy.  She would like to proceed and is scheduled for right carpal tunnel release as outpatient under regional anesthesia.  [name] R 01/04/2013, 11:49 AM

## 2013-01-04 NOTE — Anesthesia Preprocedure Evaluation (Addendum)
Anesthesia Evaluation  Patient identified by MRN, date of birth, ID band Patient awake    Airway Mallampati: I TM Distance: >3 FB Neck ROM: Full    Dental  (+) Teeth Intact and Dental Advisory Given   Pulmonary  breath sounds clear to auscultation        Cardiovascular + dysrhythmias (RBBB) Rhythm:Regular Rate:Normal     Neuro/Psych    GI/Hepatic   Endo/Other  Hypothyroidism   Renal/GU      Musculoskeletal   Abdominal   Peds  Hematology   Anesthesia Other Findings   Reproductive/Obstetrics                          Anesthesia Physical Anesthesia Plan  ASA: I  Anesthesia Plan: MAC and Bier Block   Post-op Pain Management:    Induction: Intravenous  Airway Management Planned: Simple Face Mask  Additional Equipment:   Intra-op Plan:   Post-operative Plan:   Informed Consent: I have reviewed the patients History and Physical, chart, labs and discussed the procedure including the risks, benefits and alternatives for the proposed anesthesia with the patient or authorized representative who has indicated his/her understanding and acceptance.     Plan Discussed with: CRNA, Anesthesiologist and Surgeon  Anesthesia Plan Comments:         Anesthesia Quick Evaluation

## 2013-01-04 NOTE — Anesthesia Procedure Notes (Signed)
Procedure Name: MAC Date/Time: 01/04/2013 12:45 PM Performed by: Nicki Reaper Pre-anesthesia Checklist: Timeout performed, Patient identified, Emergency Drugs available, Suction available and Patient being monitored Patient Re-evaluated:Patient Re-evaluated prior to inductionOxygen Delivery Method: Simple face mask    Anesthesia Regional Block:  Bier block (IV Regional)  Pre-Anesthetic Checklist: ,, timeout performed, Correct Patient, Correct Site, Correct Laterality, Correct Procedure,, site marked, surgical consent,, at surgeon's request Needles:  Injection technique: Single-shot  Needle Type: Other      Needle Gauge: 20 and 20 G    Additional Needles: Bier block (IV Regional) Narrative:   Performed by: Personally   Bier block (IV Regional)

## 2013-01-07 ENCOUNTER — Encounter (HOSPITAL_BASED_OUTPATIENT_CLINIC_OR_DEPARTMENT_OTHER): Payer: Self-pay | Admitting: Orthopedic Surgery

## 2013-01-17 ENCOUNTER — Other Ambulatory Visit: Payer: Self-pay

## 2013-05-20 ENCOUNTER — Encounter: Payer: Self-pay | Admitting: Women's Health

## 2013-05-22 ENCOUNTER — Encounter: Payer: Self-pay | Admitting: Women's Health

## 2013-10-04 ENCOUNTER — Encounter: Payer: Self-pay | Admitting: Women's Health

## 2013-10-04 ENCOUNTER — Ambulatory Visit (INDEPENDENT_AMBULATORY_CARE_PROVIDER_SITE_OTHER): Payer: Medicare Other | Admitting: Women's Health

## 2013-10-04 VITALS — BP 122/76 | Ht 63.5 in | Wt 143.2 lb

## 2013-10-04 DIAGNOSIS — M949 Disorder of cartilage, unspecified: Secondary | ICD-10-CM

## 2013-10-04 DIAGNOSIS — Z01419 Encounter for gynecological examination (general) (routine) without abnormal findings: Secondary | ICD-10-CM

## 2013-10-04 DIAGNOSIS — F411 Generalized anxiety disorder: Secondary | ICD-10-CM

## 2013-10-04 DIAGNOSIS — M899 Disorder of bone, unspecified: Secondary | ICD-10-CM | POA: Diagnosis not present

## 2013-10-04 DIAGNOSIS — M8580 Other specified disorders of bone density and structure, unspecified site: Secondary | ICD-10-CM

## 2013-10-04 DIAGNOSIS — F419 Anxiety disorder, unspecified: Secondary | ICD-10-CM

## 2013-10-04 MED ORDER — ALPRAZOLAM 0.25 MG PO TABS: 0.2500 mg | ORAL_TABLET | ORAL | Status: DC | PRN

## 2013-10-04 NOTE — Patient Instructions (Signed)
Health Recommendations for Postmenopausal Women Respected and ongoing research has looked at the most common causes of death, disability, and poor quality of life in postmenopausal women. The causes include heart disease, diseases of blood vessels, diabetes, depression, cancer, and bone loss (osteoporosis). Many things can be done to help lower the chances of developing these and other common problems. CARDIOVASCULAR DISEASE Heart Disease: A heart attack is a medical emergency. Know the signs and symptoms of a heart attack. Below are things women can do to reduce their risk for heart disease.   Do not smoke. If you smoke, quit.  Aim for a healthy weight. Being overweight causes many preventable deaths. Eat a healthy and balanced diet and drink an adequate amount of liquids.  Get moving. Make a commitment to be more physically active. Aim for 30 minutes of activity on most, if not all days of the week.  Eat for heart health. Choose a diet that is low in saturated fat and cholesterol and eliminate trans fat. Include whole grains, vegetables, and fruits. Read and understand the labels on food containers before buying.  Know your numbers. Ask your caregiver to check your blood pressure, cholesterol (total, HDL, LDL, triglycerides) and blood glucose. Work with your caregiver on improving your entire clinical picture.  High blood pressure. Limit or stop your table salt intake (try salt substitute and food seasonings). Avoid salty foods and drinks. Read labels on food containers before buying. Eating well and exercising can help control high blood pressure. STROKE  Stroke is a medical emergency. Stroke may be the result of a blood clot in a blood vessel in the brain or by a brain hemorrhage (bleeding). Know the signs and symptoms of a stroke. To lower the risk of developing a stroke:  Avoid fatty foods.  Quit smoking.  Control your diabetes, blood pressure, and irregular heart rate. THROMBOPHLEBITIS  (BLOOD CLOT) OF THE LEG  Becoming overweight and leading a stationary lifestyle may also contribute to developing blood clots. Controlling your diet and exercising will help lower the risk of developing blood clots. CANCER SCREENING  Breast Cancer: Take steps to reduce your risk of breast cancer.  You should practice "breast self-awareness." This means understanding the normal appearance and feel of your breasts and should include breast self-examination. Any changes detected, no matter how small, should be reported to your caregiver.  After age 40, you should have a clinical breast exam (CBE) every year.  Starting at age 40, you should consider having a mammogram (breast X-ray) every year.  If you have a family history of breast cancer, talk to your caregiver about genetic screening.  If you are at high risk for breast cancer, talk to your caregiver about having an MRI and a mammogram every year.  Intestinal or Stomach Cancer: Tests to consider are a rectal exam, fecal occult blood, sigmoidoscopy, and colonoscopy. Women who are high risk may need to be screened at an earlier age and more often.  Cervical Cancer:  Beginning at age 30, you should have a Pap test every 3 years as long as the past 3 Pap tests have been normal.  If you have had past treatment for cervical cancer or a condition that could lead to cancer, you need Pap tests and screening for cancer for at least 20 years after your treatment.  If you had a hysterectomy for a problem that was not cancer or a condition that could lead to cancer, then you no longer need Pap tests.    If you are between ages 65 and 70, and you have had normal Pap tests going back 10 years, you no longer need Pap tests.  If Pap tests have been discontinued, risk factors (such as a new sexual partner) need to be reassessed to determine if screening should be resumed.  Some medical problems can increase the chance of getting cervical cancer. In these  cases, your caregiver may recommend more frequent screening and Pap tests.  Uterine Cancer: If you have vaginal bleeding after reaching menopause, you should notify your caregiver.  Ovarian Cancer: Other than yearly pelvic exams, there are no reliable tests available to screen for ovarian cancer at this time except for yearly pelvic exams.  Lung Cancer: Yearly chest X-rays can detect lung cancer and should be done on high risk women, such as cigarette smokers and women with chronic lung disease (emphysema).  Skin Cancer: A complete body skin exam should be done at your yearly examination. Avoid overexposure to the sun and ultraviolet light lamps. Use a strong sun block cream when in the sun. All of these things are important for lowering the risk of skin cancer. MENOPAUSE Menopause Symptoms: Hormone therapy products are effective for treating symptoms associated with menopause:  Moderate to severe hot flashes.  Night sweats.  Mood swings.  Headaches.  Tiredness.  Loss of sex drive.  Insomnia.  Other symptoms. Hormone replacement carries certain risks, especially in older women. Women who use or are thinking about using estrogen or estrogen with progestin treatments should discuss that with their caregiver. Your caregiver will help you understand the benefits and risks. The ideal dose of hormone replacement therapy is not known. The Food and Drug Administration (FDA) has concluded that hormone therapy should be used only at the lowest doses and for the shortest amount of time to reach treatment goals.  OSTEOPOROSIS Protecting Against Bone Loss and Preventing Fracture If you use hormone therapy for prevention of bone loss (osteoporosis), the risks for bone loss must outweigh the risk of the therapy. Ask your caregiver about other medications known to be safe and effective for preventing bone loss and fractures. To guard against bone loss or fractures, the following is recommended:  If  you are younger than age 50, take 1000 mg of calcium and at least 600 mg of Vitamin D per day.  If you are older than age 50 but younger than age 70, take 1200 mg of calcium and at least 600 mg of Vitamin D per day.  If you are older than age 70, take 1200 mg of calcium and at least 800 mg of Vitamin D per day. Smoking and excessive alcohol intake increases the risk of osteoporosis. Eat foods rich in calcium and vitamin D and do weight bearing exercises several times a week as your caregiver suggests. DIABETES Diabetes Mellitus: If you have type I or type 2 diabetes, you should keep your blood sugar under control with diet, exercise, and recommended medication. Avoid starchy and fatty foods, and too many sweets. Being overweight can make diabetes control more difficult. COGNITION AND MEMORY Cognition and Memory: Menopausal hormone therapy is not recommended for the prevention of cognitive disorders such as Alzheimer's disease or memory loss.  DEPRESSION  Depression may occur at any age, but it is common in elderly women. This may be because of physical, medical, social (loneliness), or financial problems and needs. If you are experiencing depression because of medical problems and control of symptoms, talk to your caregiver about this. Physical   activity and exercise may help with mood and sleep. Community and volunteer involvement may improve your sense of value and worth. If you have depression and you feel that the problem is getting worse or becoming severe, talk to your caregiver about which treatment options are best for you. ACCIDENTS  Accidents are common and can be serious in elderly woman. Prepare your house to prevent accidents. Eliminate throw rugs, place hand bars in bath, shower, and toilet areas. Avoid wearing high heeled shoes or walking on wet, snowy, and icy areas. Limit or stop driving if you have vision or hearing problems, or if you feel you are unsteady with your movements and  reflexes. HEPATITIS C Hepatitis C is a type of viral infection affecting the liver. It is spread mainly through contact with blood from an infected person. It can be treated, but if left untreated, it can lead to severe liver damage over the years. Many people who are infected do not know that the virus is in their blood. If you are a "baby-boomer", it is recommended that you have one screening test for Hepatitis C. IMMUNIZATIONS  Several immunizations are important to consider having during your senior years, including:   Tetanus, diphtheria, and pertussis booster shot.  Influenza every year before the flu season begins.  Pneumonia vaccine.  Shingles vaccine.  Others, as indicated based on your specific needs. Talk to your caregiver about these. Document Released: 04/22/2005 Document Revised: 07/15/2013 Document Reviewed: 12/17/2007 Exit[employer] Patient Information 2015 [employer]. This information is not intended to replace advice given to you by your health care provider. Make sure you discuss any questions you have with your health care provider.  

## 2013-10-04 NOTE — Progress Notes (Signed)
Lauren Francis 11/03/1945 [id number]    History:    Presents for annual exam. Postmenopausal/no HRT/no bleeding. Pain in right thumb from carpal tunnel surgery, followup scheduled.  Dexa scan 05/22/13 T score -1.9  radius, -1.5 at right femoral neck FRAX 11.3 %/ 2.1%. Normal Mammograms and paps. Current on vaccines. 2008 negative colonoscopy. Hypothyroid/ hypercholesterolemia managed by primary care.  Past medical history, past surgical history, family history and social history were all reviewed and documented in the EPIC chart. 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 Ovid Curd, granddaughter with spina bifida doing well.  ROS:  A  12 point ROS was performed and pertinent positives and negatives are included.  Exam:  Filed Vitals:   10/04/13 0858  BP: 122/76    General appearance:  Normal Thyroid:  Symmetrical, normal in size, without palpable masses or nodularity. Respiratory  Auscultation:  Clear without wheezing or rhonchi Cardiovascular  Auscultation:  Regular rate, without rubs, murmurs or gallops  Edema/varicosities:  Not grossly evident Abdominal  Soft,nontender, without masses, guarding or rebound.  Liver/spleen:  No organomegaly noted  Hernia:  None appreciated  Skin  Inspection:  Grossly normal   Breasts: Examined lying and sitting.     Right: Without masses, retractions, discharge or axillary adenopathy.     Left: Without masses, retractions, discharge or axillary adenopathy. Gentitourinary   Inguinal/mons:  Normal without inguinal adenopathy  External genitalia:  Normal  BUS/Urethra/Skene's glands:  Normal  Vagina:  Normal  Cervix:  Normal  Uterus:  verted, normal in size, shape and contour.  Midline and mobile  Adnexa/parametria:     Rt: Without masses or tenderness.   Lt: Without masses or tenderness.  Anus and perineum: Hemorrhoids  Digital rectal exam: Normal sphincter tone without palpated masses or tenderness  Assessment/Plan:  68  y.o. MWF G2P2  for annual exam with no complaints.  Postmenopausal/no bleeding/no HRT Osteopenia Hypothyroid/hypercholesterolemia managed by primary care labs and meds Carpal tunnel surgery with residual right thumb problem  Plan: Continue with heart healthy diet and physical activity. Fall prevention and safety discuses. SBE's, annual mammogram, continue 3-D tomography reviewed and encouraged history of dense breasts.  Follow-up with Dermatologist for skin exam, numerous keratosis. Pap Normal 2013, new screening guidelines reviewed.   Note: This dictation was prepared with Dragon/digital dictation.  Any transcriptional errors that result are unintentional. Huel Cote [facility], 9:35 AM 10/04/2013

## 2013-11-24 ENCOUNTER — Encounter: Payer: Self-pay | Admitting: *Deleted

## 2013-12-20 ENCOUNTER — Other Ambulatory Visit: Payer: Self-pay | Admitting: Orthopedic Surgery

## 2013-12-27 ENCOUNTER — Other Ambulatory Visit: Payer: Self-pay

## 2013-12-31 ENCOUNTER — Encounter (HOSPITAL_BASED_OUTPATIENT_CLINIC_OR_DEPARTMENT_OTHER): Admission: RE | Payer: Self-pay | Source: Ambulatory Visit

## 2013-12-31 ENCOUNTER — Ambulatory Visit (HOSPITAL_BASED_OUTPATIENT_CLINIC_OR_DEPARTMENT_OTHER): Admission: RE | Admit: 2013-12-31 | Payer: Medicare Other | Source: Ambulatory Visit | Admitting: Orthopedic Surgery

## 2013-12-31 SURGERY — RELEASE, A1 PULLEY, FOR TRIGGER FINGER
Anesthesia: General | Site: Thumb | Laterality: Right

## 2014-01-13 ENCOUNTER — Encounter: Payer: Self-pay | Admitting: *Deleted

## 2014-09-08 ENCOUNTER — Other Ambulatory Visit: Payer: Self-pay

## 2014-10-02 ENCOUNTER — Encounter: Payer: Self-pay | Admitting: Women's Health

## 2014-10-30 ENCOUNTER — Ambulatory Visit (INDEPENDENT_AMBULATORY_CARE_PROVIDER_SITE_OTHER): Payer: PPO | Admitting: Women's Health

## 2014-10-30 ENCOUNTER — Encounter: Payer: Self-pay | Admitting: Women's Health

## 2014-10-30 VITALS — BP 150/78 | Ht 63.0 in | Wt 145.0 lb

## 2014-10-30 DIAGNOSIS — Z01419 Encounter for gynecological examination (general) (routine) without abnormal findings: Secondary | ICD-10-CM | POA: Diagnosis not present

## 2014-10-30 NOTE — Progress Notes (Signed)
MALETA PACHA December 10, 1945 [id number]    History:    Presents for breast and pelvic exam. Postmenopausal on no HRT with no bleeding. Normal Pap and mammogram history. 05/2013 DEXA T score -1.9 at radius,  -1.5 at femoral neck FRAX 11.3%/2.1%.  2008 negative colonoscopy. Current on vaccines. Hypothyroidism and hypercholesterolemia managed by primary care.  Past medical history, past surgical history, family history and social history were all reviewed and documented in the EPIC chart. Helps care for granddaughter with spina bifida.  ROS:  A ROS was performed and pertinent positives and negatives are included.  Exam:  Filed Vitals:   10/30/14 0903  BP: 150/78    General appearance:  Normal Thyroid:  Symmetrical, normal in size, without palpable masses or nodularity. Respiratory  Auscultation:  Clear without wheezing or rhonchi Cardiovascular  Auscultation:  Regular rate, without rubs, murmurs or gallops  Edema/varicosities:  Not grossly evident Abdominal  Soft,nontender, without masses, guarding or rebound.  Liver/spleen:  No organomegaly noted  Hernia:  None appreciated  Skin  Inspection:  Grossly normal   Breasts: Examined lying and sitting.     Right: Without masses, retractions, discharge or axillary adenopathy.     Left: Without masses, retractions, discharge or axillary adenopathy. Gentitourinary   Inguinal/mons:  Normal without inguinal adenopathy  External genitalia:  Normal  BUS/Urethra/Skene's glands:  Normal  Vagina:  Atrophic  Cervix:  Normal  Uterus:   normal in size, shape and contour.  Midline and mobile  Adnexa/parametria:     Rt: Without masses or tenderness.   Lt: Without masses or tenderness.  Anus and perineum: Multiple nonthrombosed hemorrhoids  Digital rectal exam: Internal hemorrhoids  Assessment/Plan:  69 y.o. MWF G2P2 for breast and pelvic exam.  Osteopenia without elevated FRAX Postmenopausal/no HRT/no bleeding/vaginal  atrophy Hypothyroid/hypercholesterolemia-primary care manages labs and meds Hemorrhoids  Plan: Home safety, fall prevention and importance of weightbearing exercise reviewed. Continue active lifestyle. SBE's, continue annual 3-D mammogram history of dense breast. Vitamin D 2000 daily encouraged. Recheck blood pressure blood pressure elevated twice today reports 130/68 today at home. Reviewed importance of preventing constipation avoiding straining increasing fluids and fiber in diet. Follow-up with surgeon as needed. Continue vaginal lubricants with intercourse.   [place], 9:45 AM 10/30/2014

## 2014-10-30 NOTE — Patient Instructions (Signed)
Health Recommendations for Postmenopausal Women Respected and ongoing research has looked at the most common causes of death, disability, and poor quality of life in postmenopausal women. The causes include heart disease, diseases of blood vessels, diabetes, depression, cancer, and bone loss (osteoporosis). Many things can be done to help lower the chances of developing these and other common problems. CARDIOVASCULAR DISEASE Heart Disease: A heart attack is a medical emergency. Know the signs and symptoms of a heart attack. Below are things women can do to reduce their risk for heart disease.   Do not smoke. If you smoke, quit.  Aim for a healthy weight. Being overweight causes many preventable deaths. Eat a healthy and balanced diet and drink an adequate amount of liquids.  Get moving. Make a commitment to be more physically active. Aim for 30 minutes of activity on most, if not all days of the week.  Eat for heart health. Choose a diet that is low in saturated fat and cholesterol and eliminate trans fat. Include whole grains, vegetables, and fruits. Read and understand the labels on food containers before buying.  Know your numbers. Ask your caregiver to check your blood pressure, cholesterol (total, HDL, LDL, triglycerides) and blood glucose. Work with your caregiver on improving your entire clinical picture.  High blood pressure. Limit or stop your table salt intake (try salt substitute and food seasonings). Avoid salty foods and drinks. Read labels on food containers before buying. Eating well and exercising can help control high blood pressure. STROKE  Stroke is a medical emergency. Stroke may be the result of a blood clot in a blood vessel in the brain or by a brain hemorrhage (bleeding). Know the signs and symptoms of a stroke. To lower the risk of developing a stroke:  Avoid fatty foods.  Quit smoking.  Control your diabetes, blood pressure, and irregular heart rate. THROMBOPHLEBITIS  (BLOOD CLOT) OF THE LEG  Becoming overweight and leading a stationary lifestyle may also contribute to developing blood clots. Controlling your diet and exercising will help lower the risk of developing blood clots. CANCER SCREENING  Breast Cancer: Take steps to reduce your risk of breast cancer.  You should practice "breast self-awareness." This means understanding the normal appearance and feel of your breasts and should include breast self-examination. Any changes detected, no matter how small, should be reported to your caregiver.  After age 40, you should have a clinical breast exam (CBE) every year.  Starting at age 40, you should consider having a mammogram (breast X-ray) every year.  If you have a family history of breast cancer, talk to your caregiver about genetic screening.  If you are at high risk for breast cancer, talk to your caregiver about having an MRI and a mammogram every year.  Intestinal or Stomach Cancer: Tests to consider are a rectal exam, fecal occult blood, sigmoidoscopy, and colonoscopy. Women who are high risk may need to be screened at an earlier age and more often.  Cervical Cancer:  Beginning at age 30, you should have a Pap test every 3 years as long as the past 3 Pap tests have been normal.  If you have had past treatment for cervical cancer or a condition that could lead to cancer, you need Pap tests and screening for cancer for at least 20 years after your treatment.  If you had a hysterectomy for a problem that was not cancer or a condition that could lead to cancer, then you no longer need Pap tests.    If you are between ages 65 and 70, and you have had normal Pap tests going back 10 years, you no longer need Pap tests.  If Pap tests have been discontinued, risk factors (such as a new sexual partner) need to be reassessed to determine if screening should be resumed.  Some medical problems can increase the chance of getting cervical cancer. In these  cases, your caregiver may recommend more frequent screening and Pap tests.  Uterine Cancer: If you have vaginal bleeding after reaching menopause, you should notify your caregiver.  Ovarian Cancer: Other than yearly pelvic exams, there are no reliable tests available to screen for ovarian cancer at this time except for yearly pelvic exams.  Lung Cancer: Yearly chest X-rays can detect lung cancer and should be done on high risk women, such as cigarette smokers and women with chronic lung disease (emphysema).  Skin Cancer: A complete body skin exam should be done at your yearly examination. Avoid overexposure to the sun and ultraviolet light lamps. Use a strong sun block cream when in the sun. All of these things are important for lowering the risk of skin cancer. MENOPAUSE Menopause Symptoms: Hormone therapy products are effective for treating symptoms associated with menopause:  Moderate to severe hot flashes.  Night sweats.  Mood swings.  Headaches.  Tiredness.  Loss of sex drive.  Insomnia.  Other symptoms. Hormone replacement carries certain risks, especially in older women. Women who use or are thinking about using estrogen or estrogen with progestin treatments should discuss that with their caregiver. Your caregiver will help you understand the benefits and risks. The ideal dose of hormone replacement therapy is not known. The Food and Drug Administration (FDA) has concluded that hormone therapy should be used only at the lowest doses and for the shortest amount of time to reach treatment goals.  OSTEOPOROSIS Protecting Against Bone Loss and Preventing Fracture If you use hormone therapy for prevention of bone loss (osteoporosis), the risks for bone loss must outweigh the risk of the therapy. Ask your caregiver about other medications known to be safe and effective for preventing bone loss and fractures. To guard against bone loss or fractures, the following is recommended:  If  you are younger than age 50, take 1000 mg of calcium and at least 600 mg of Vitamin D per day.  If you are older than age 50 but younger than age 70, take 1200 mg of calcium and at least 600 mg of Vitamin D per day.  If you are older than age 70, take 1200 mg of calcium and at least 800 mg of Vitamin D per day. Smoking and excessive alcohol intake increases the risk of osteoporosis. Eat foods rich in calcium and vitamin D and do weight bearing exercises several times a week as your caregiver suggests. DIABETES Diabetes Mellitus: If you have type I or type 2 diabetes, you should keep your blood sugar under control with diet, exercise, and recommended medication. Avoid starchy and fatty foods, and too many sweets. Being overweight can make diabetes control more difficult. COGNITION AND MEMORY Cognition and Memory: Menopausal hormone therapy is not recommended for the prevention of cognitive disorders such as Alzheimer's disease or memory loss.  DEPRESSION  Depression may occur at any age, but it is common in elderly women. This may be because of physical, medical, social (loneliness), or financial problems and needs. If you are experiencing depression because of medical problems and control of symptoms, talk to your caregiver about this. Physical   activity and exercise may help with mood and sleep. Community and volunteer involvement may improve your sense of value and worth. If you have depression and you feel that the problem is getting worse or becoming severe, talk to your caregiver about which treatment options are best for you. ACCIDENTS  Accidents are common and can be serious in elderly woman. Prepare your house to prevent accidents. Eliminate throw rugs, place hand bars in bath, shower, and toilet areas. Avoid wearing high heeled shoes or walking on wet, snowy, and icy areas. Limit or stop driving if you have vision or hearing problems, or if you feel you are unsteady with your movements and  reflexes. HEPATITIS C Hepatitis C is a type of viral infection affecting the liver. It is spread mainly through contact with blood from an infected person. It can be treated, but if left untreated, it can lead to severe liver damage over the years. Many people who are infected do not know that the virus is in their blood. If you are a "baby-boomer", it is recommended that you have one screening test for Hepatitis C. IMMUNIZATIONS  Several immunizations are important to consider having during your senior years, including:   Tetanus, diphtheria, and pertussis booster shot.  Influenza every year before the flu season begins.  Pneumonia vaccine.  Shingles vaccine.  Others, as indicated based on your specific needs. Talk to your caregiver about these. Document Released: 04/22/2005 Document Revised: 07/15/2013 Document Reviewed: 12/17/2007 Exit[employer] Patient Information 2015 [employer]. This information is not intended to replace advice given to you by your health care provider. Make sure you discuss any questions you have with your health care provider.  

## 2014-11-28 IMAGING — US US CAROTID DUPLEX BILAT
1 series · 13 of 24 positions shown · non-contrast
Comparison: None.

CLINICAL DATA: Right-sided carotid bruit, history of hyperlipidemia

BILATERAL CAROTID DUPLEX ULTRASOUND
TECHNIQUE: Gray scale imaging, color Doppler and duplex ultrasound
were performed of bilateral carotid and vertebral arteries in the
neck.

[Series 1: us carotid duplex bilat · 0.06mm/px · 13 of 55 slices shown]
[im 1/55]
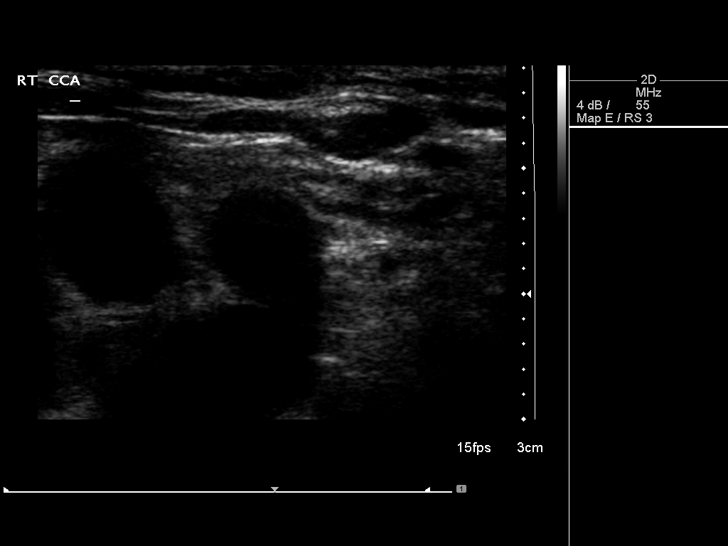
[im 5/55]
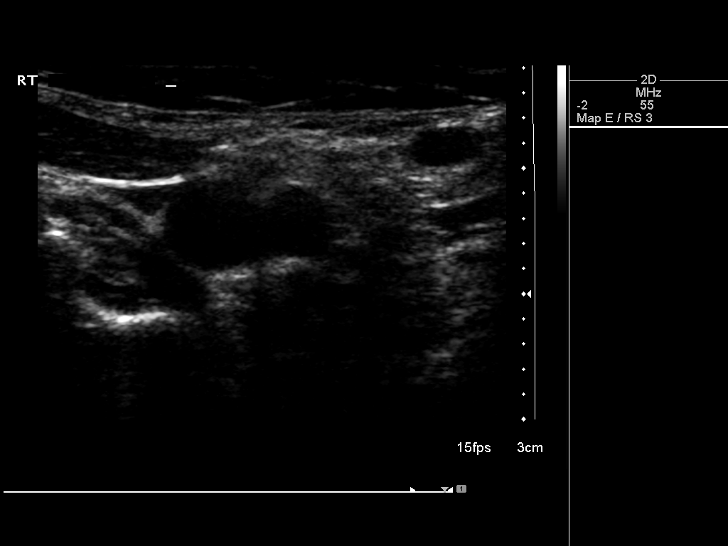
[im 10/55]
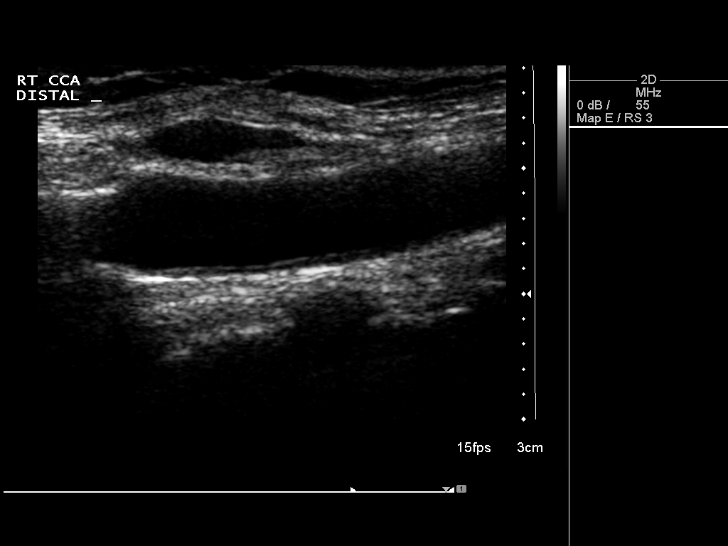
[im 15/55]
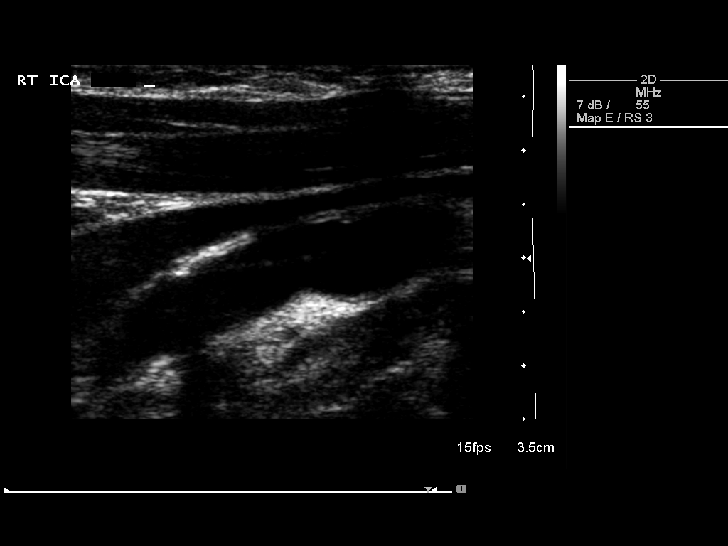
[im 19/55]
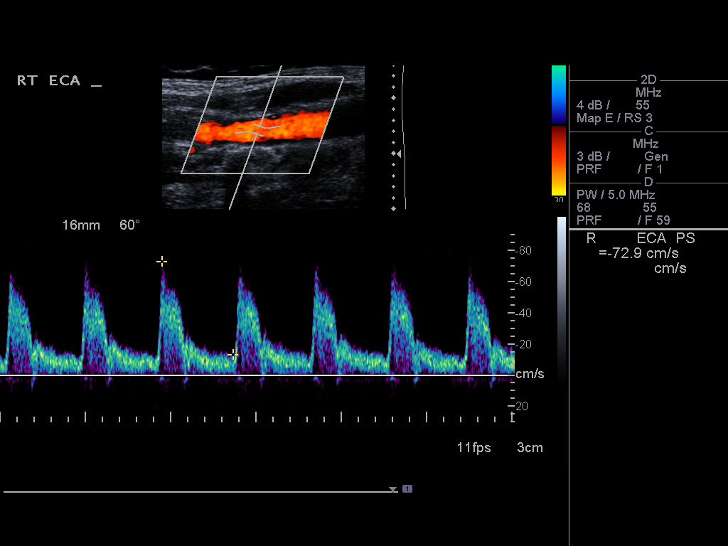
[im 24/55]
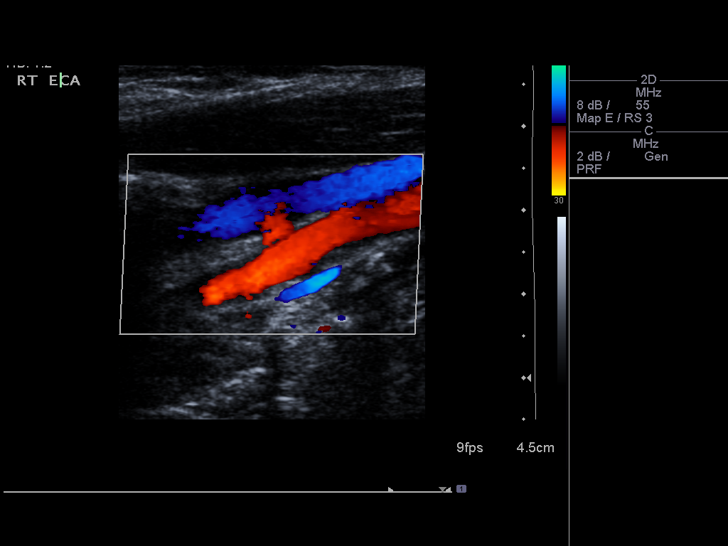
[im 29/55]
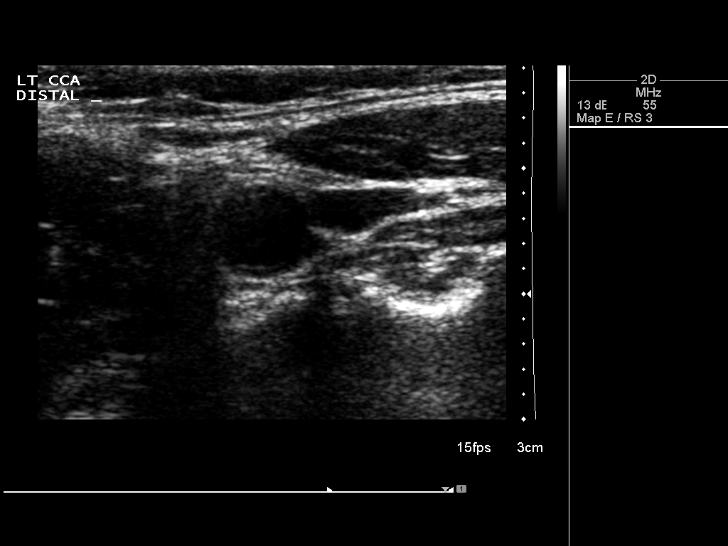
[im 31/55]
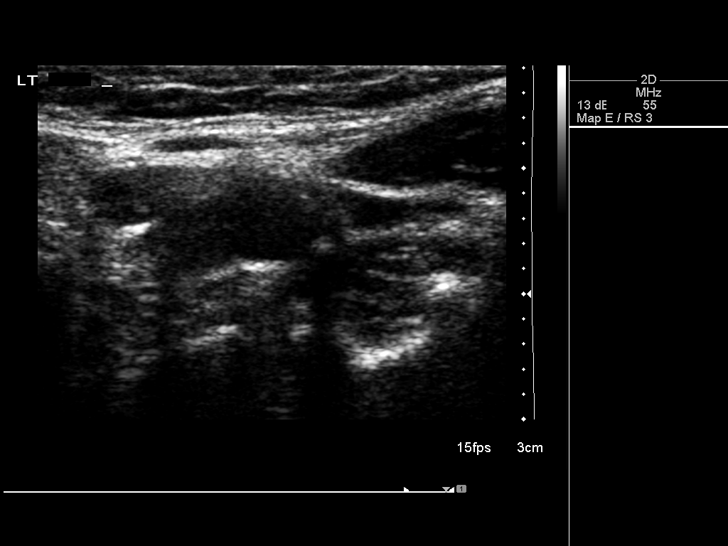
[im 36/55]
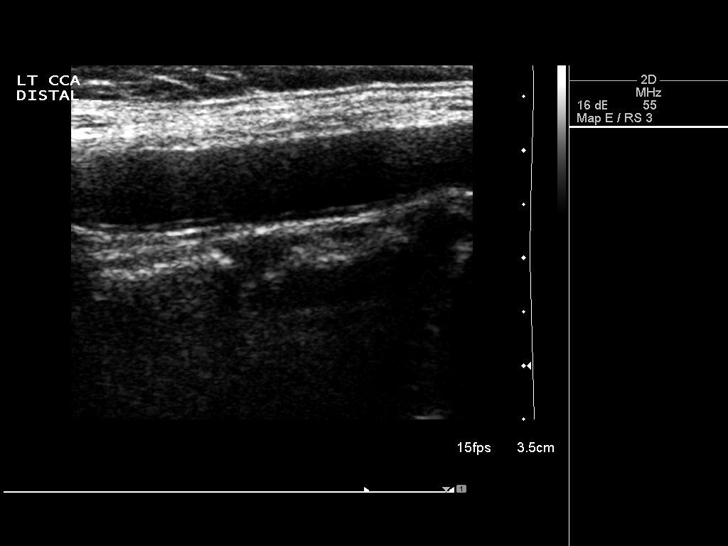
[im 40/55]
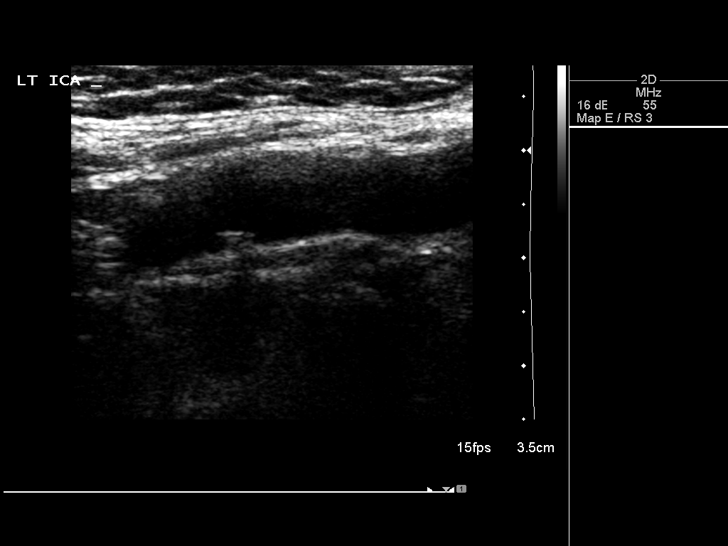
[im 45/55]
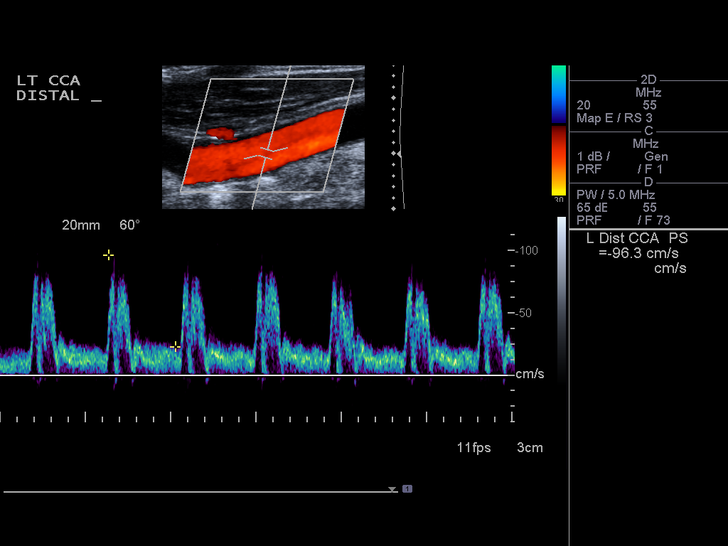
[im 50/55]
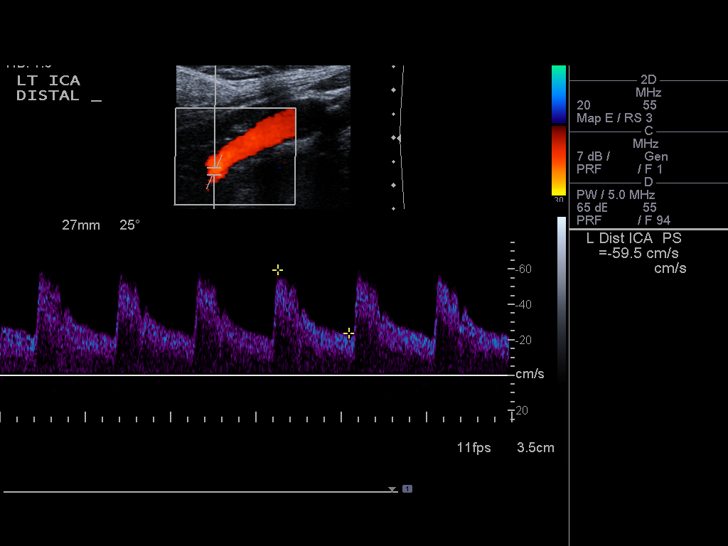
[im 55/55]
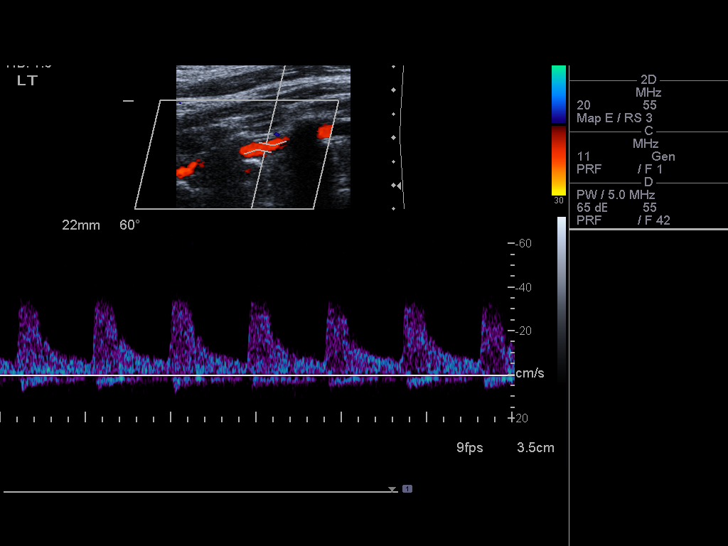

[13 of 24 positions shown; findings below may reference images not displayed]

Criteria:  Quantification of carotid stenosis is based on velocity
parameters that correlate the residual internal carotid diameter
with NASCET-based stenosis levels, using the diameter of the distal
internal carotid lumen as the denominator for stenosis measurement.

The following velocity measurements were obtained:

                 PEAK SYSTOLIC/END DIASTOLIC
RIGHT
ICA:                        85/25cm/sec
CCA:                        87/19cm/sec
SYSTOLIC ICA/CCA RATIO:
DIASTOLIC ICA/CCA RATIO:
ECA:                        73cm/sec

LEFT
ICA:                        72/24cm/sec
CCA:                        96/23cm/sec
SYSTOLIC ICA/CCA RATIO:
DIASTOLIC ICA/CCA RATIO:
ECA:                        58cm/sec
FINDINGS: RIGHT CAROTID ARTERY: There is a very minimal amount of eccentric
intimal wall thickening and partially shadowing echogenic plaque
involving the origin and proximal aspect of the right internal
carotid artery (images 13 and 16), not resulting elevated peak
systolic velocity within the right internal carotid artery to
suggest a hemodynamically significant stenosis.

RIGHT VERTEBRAL ARTERY:  Antegrade flow

LEFT CAROTID ARTERY: There is a minimal amount of eccentric
echogenic partially shadowing plaque within the left carotid bulb
(image 32), extending to involve the origin and proximal aspect of
the left internal carotid artery (images 43 and 44), not resulting
elevated peak systolic velocity with the left internal carotid
artery to suggest a hemodynamically significant stenosis.

LEFT VERTEBRAL ARTERY:  Antegrade flow
IMPRESSION: Bilateral atherosclerotic plaque, left subjectively greater than
right, not resulting in hemodynamically significant stenosis.

## 2015-03-20 DIAGNOSIS — M65341 Trigger finger, right ring finger: Secondary | ICD-10-CM | POA: Diagnosis not present

## 2015-06-09 DIAGNOSIS — M65341 Trigger finger, right ring finger: Secondary | ICD-10-CM | POA: Diagnosis not present

## 2015-07-15 DIAGNOSIS — L723 Sebaceous cyst: Secondary | ICD-10-CM | POA: Diagnosis not present

## 2015-07-15 DIAGNOSIS — Z1283 Encounter for screening for malignant neoplasm of skin: Secondary | ICD-10-CM | POA: Diagnosis not present

## 2015-09-03 DIAGNOSIS — R1030 Lower abdominal pain, unspecified: Secondary | ICD-10-CM | POA: Diagnosis not present

## 2015-09-17 DIAGNOSIS — M65341 Trigger finger, right ring finger: Secondary | ICD-10-CM | POA: Diagnosis not present

## 2015-09-24 DIAGNOSIS — B349 Viral infection, unspecified: Secondary | ICD-10-CM | POA: Diagnosis not present

## 2015-10-09 DIAGNOSIS — Z1231 Encounter for screening mammogram for malignant neoplasm of breast: Secondary | ICD-10-CM | POA: Diagnosis not present

## 2015-10-09 DIAGNOSIS — M85832 Other specified disorders of bone density and structure, left forearm: Secondary | ICD-10-CM | POA: Diagnosis not present

## 2015-10-09 DIAGNOSIS — M8589 Other specified disorders of bone density and structure, multiple sites: Secondary | ICD-10-CM | POA: Diagnosis not present

## 2015-10-16 ENCOUNTER — Encounter: Payer: Self-pay | Admitting: Women's Health

## 2015-10-21 DIAGNOSIS — Z4789 Encounter for other orthopedic aftercare: Secondary | ICD-10-CM | POA: Diagnosis not present

## 2015-10-26 ENCOUNTER — Encounter: Payer: Self-pay | Admitting: Women's Health

## 2015-11-04 ENCOUNTER — Encounter: Payer: Self-pay | Admitting: Women's Health

## 2015-11-04 ENCOUNTER — Ambulatory Visit (INDEPENDENT_AMBULATORY_CARE_PROVIDER_SITE_OTHER): Payer: PPO | Admitting: Women's Health

## 2015-11-04 VITALS — BP 124/70 | Ht 63.0 in | Wt 144.0 lb

## 2015-11-04 DIAGNOSIS — Z01419 Encounter for gynecological examination (general) (routine) without abnormal findings: Secondary | ICD-10-CM | POA: Diagnosis not present

## 2015-11-04 DIAGNOSIS — N952 Postmenopausal atrophic vaginitis: Secondary | ICD-10-CM | POA: Diagnosis not present

## 2015-11-04 DIAGNOSIS — F419 Anxiety disorder, unspecified: Secondary | ICD-10-CM

## 2015-11-04 DIAGNOSIS — M8580 Other specified disorders of bone density and structure, unspecified site: Secondary | ICD-10-CM | POA: Diagnosis not present

## 2015-11-04 MED ORDER — ALPRAZOLAM 0.25 MG PO TABS: 0.2500 mg | ORAL_TABLET | ORAL | 1 refills | Status: DC | PRN

## 2015-11-04 NOTE — Patient Instructions (Signed)

## 2015-11-04 NOTE — Progress Notes (Signed)
Lauren Francis 1945-05-27 [id number]    History:    Presents for breast and pelvic exam. Reports having a bad year- husbands health, he had a stroke and aneurysm surgery but is now doing better. Postmenopausal on no HRT with no bleeding. Normal Pap and mammogram history. Current on vaccines. 2008 negative colonoscopy. Hypothyroid and hypercholesterolemia primary care manages. 10/2015 DEXA -2.3 radius T-scores at hip and spine slightly better/stable. FRAX 11.3%/2.1%. Had a back injury about a month ago from exercise, she had been out of the exercise routine for greater than 8 months and over did it after going back.  Past medical history, past surgical history, family history and social history were all reviewed and documented in the EPIC chart. Has 2 children and granddaughter Ovid Curd now 81, with spina bifida is doing well. States has not done any quilting or golfing in the past year due to husbands health but is now starting back.  ROS:  A ROS was performed and pertinent positives and negatives are included.  Exam:  Vitals:   11/04/15 0937  BP: 124/70  Weight: 144 lb (65.3 kg)  Height: 5\' 3"  (1.6 m)   Body mass index is 25.51 kg/m.   General appearance:  Normal Thyroid:  Symmetrical, normal in size, without palpable masses or nodularity. Respiratory  Auscultation:  Clear without wheezing or rhonchi Cardiovascular  Auscultation:  Regular rate, without rubs, murmurs or gallops  Edema/varicosities:  Not grossly evident Abdominal  Soft,nontender, without masses, guarding or rebound.  Liver/spleen:  No organomegaly noted  Hernia:  None appreciated  Skin  Inspection:  Grossly normal   Breasts: Examined lying and sitting.     Right: Without masses, retractions, discharge or axillary adenopathy.     Left: Without masses, retractions, discharge or axillary adenopathy. Gentitourinary   Inguinal/mons:  Normal without inguinal adenopathy  External genitalia:   Normal  BUS/Urethra/Skene's glands:  Normal  Vagina:  Atrophic  Cervix:  Normal  Uterus:  normal in size, shape and contour.  Midline and mobile  Adnexa/parametria:     Rt: Without masses or tenderness.   Lt: Without masses or tenderness.  Anus and perineum: Normal  Digital rectal exam: Normal sphincter tone without palpated masses or tenderness  Assessment/Plan:  70 y.o. MWF G2 P2  for breast and pelvic exam.  Postmenopausal on no HRT with no bleeding Atrophic vaginitis Osteopenia without elevated FRAX Hypothyroid/hypercholesterolemia-primary care manages labs and meds Back injury-resolving orthopedist managing  Plan: SBE's, continue annual screening mammogram 3-D tomography reviewed and encouraged history of dense breast. Continue annual skin checks has numerous moles. Exercise, calcium rich diet, vitamin D 1000 daily encouraged. Home safety, fall prevention and importance of weightbearing exercise reviewed. Xanax 0.25 prescription, proper use given and reviewed for situational stress, uses less than one prescription annually. Aware of addictive properties. UA. Continue lubricants with intercourse.  Huel Cote [facility], 10:27 AM 11/04/2015

## 2015-11-24 DIAGNOSIS — R05 Cough: Secondary | ICD-10-CM | POA: Diagnosis not present

## 2015-11-24 DIAGNOSIS — E785 Hyperlipidemia, unspecified: Secondary | ICD-10-CM | POA: Diagnosis not present

## 2015-11-24 DIAGNOSIS — E559 Vitamin D deficiency, unspecified: Secondary | ICD-10-CM | POA: Diagnosis not present

## 2015-11-24 DIAGNOSIS — E039 Hypothyroidism, unspecified: Secondary | ICD-10-CM | POA: Diagnosis not present

## 2016-02-09 DIAGNOSIS — H16223 Keratoconjunctivitis sicca, not specified as Sjogren's, bilateral: Secondary | ICD-10-CM | POA: Diagnosis not present

## 2016-02-09 DIAGNOSIS — H2513 Age-related nuclear cataract, bilateral: Secondary | ICD-10-CM | POA: Diagnosis not present

## 2016-02-09 DIAGNOSIS — H43813 Vitreous degeneration, bilateral: Secondary | ICD-10-CM | POA: Diagnosis not present

## 2016-02-09 DIAGNOSIS — H52223 Regular astigmatism, bilateral: Secondary | ICD-10-CM | POA: Diagnosis not present

## 2016-02-09 DIAGNOSIS — H5203 Hypermetropia, bilateral: Secondary | ICD-10-CM | POA: Diagnosis not present

## 2016-02-09 DIAGNOSIS — H524 Presbyopia: Secondary | ICD-10-CM | POA: Diagnosis not present

## 2016-02-09 DIAGNOSIS — H25013 Cortical age-related cataract, bilateral: Secondary | ICD-10-CM | POA: Diagnosis not present

## 2016-02-09 DIAGNOSIS — H04123 Dry eye syndrome of bilateral lacrimal glands: Secondary | ICD-10-CM | POA: Diagnosis not present

## 2016-03-02 DIAGNOSIS — M25512 Pain in left shoulder: Secondary | ICD-10-CM | POA: Diagnosis not present

## 2016-05-04 DIAGNOSIS — H2512 Age-related nuclear cataract, left eye: Secondary | ICD-10-CM | POA: Diagnosis not present

## 2016-05-04 DIAGNOSIS — H25013 Cortical age-related cataract, bilateral: Secondary | ICD-10-CM | POA: Diagnosis not present

## 2016-05-04 DIAGNOSIS — H2513 Age-related nuclear cataract, bilateral: Secondary | ICD-10-CM | POA: Diagnosis not present

## 2016-05-17 DIAGNOSIS — H04123 Dry eye syndrome of bilateral lacrimal glands: Secondary | ICD-10-CM | POA: Diagnosis not present

## 2016-05-17 DIAGNOSIS — R05 Cough: Secondary | ICD-10-CM | POA: Diagnosis not present

## 2016-07-25 DIAGNOSIS — J301 Allergic rhinitis due to pollen: Secondary | ICD-10-CM | POA: Diagnosis not present

## 2016-07-26 DIAGNOSIS — H2512 Age-related nuclear cataract, left eye: Secondary | ICD-10-CM | POA: Diagnosis not present

## 2016-07-27 DIAGNOSIS — H2511 Age-related nuclear cataract, right eye: Secondary | ICD-10-CM | POA: Diagnosis not present

## 2016-08-02 DIAGNOSIS — H2511 Age-related nuclear cataract, right eye: Secondary | ICD-10-CM | POA: Diagnosis not present

## 2016-08-17 ENCOUNTER — Other Ambulatory Visit: Payer: Self-pay | Admitting: Pharmacy Technician

## 2016-08-17 NOTE — Patient Outreach (Signed)
[facility]) Care Management  08/17/2016  Lauren Francis [birth year] [id number]   Contacted patient in reference to medication adherence for [employer]. Patient states she is taking Atorvastatin at bedtime but does forget to take it sometime. Looking at her fill history looks as though she's missing around 50% of her doses. Since it has been previously verified by [name] ([facility]) I informed the patient that she could take the Atorvastatin in the morning along with her Levothyroxine to help with adherence. We also discussed 3 month supplies and she plans to have her physician change her prescription's so that she can have them filled as such.   [name], [place] [phone]

## 2016-10-26 DIAGNOSIS — Z1231 Encounter for screening mammogram for malignant neoplasm of breast: Secondary | ICD-10-CM | POA: Diagnosis not present

## 2016-11-29 DIAGNOSIS — H04123 Dry eye syndrome of bilateral lacrimal glands: Secondary | ICD-10-CM | POA: Diagnosis not present

## 2016-12-19 DIAGNOSIS — E039 Hypothyroidism, unspecified: Secondary | ICD-10-CM | POA: Diagnosis not present

## 2016-12-19 DIAGNOSIS — E559 Vitamin D deficiency, unspecified: Secondary | ICD-10-CM | POA: Diagnosis not present

## 2016-12-19 DIAGNOSIS — E785 Hyperlipidemia, unspecified: Secondary | ICD-10-CM | POA: Diagnosis not present

## 2016-12-28 DIAGNOSIS — E559 Vitamin D deficiency, unspecified: Secondary | ICD-10-CM | POA: Diagnosis not present

## 2016-12-28 DIAGNOSIS — E039 Hypothyroidism, unspecified: Secondary | ICD-10-CM | POA: Diagnosis not present

## 2016-12-28 DIAGNOSIS — E785 Hyperlipidemia, unspecified: Secondary | ICD-10-CM | POA: Diagnosis not present

## 2017-03-13 ENCOUNTER — Encounter: Payer: Self-pay | Admitting: Gynecology

## 2017-03-13 ENCOUNTER — Ambulatory Visit: Payer: PPO | Admitting: Gynecology

## 2017-03-13 VITALS — BP 118/76

## 2017-03-13 DIAGNOSIS — N3000 Acute cystitis without hematuria: Secondary | ICD-10-CM

## 2017-03-13 MED ORDER — CIPROFLOXACIN HCL 250 MG PO TABS: 250.0000 mg | ORAL_TABLET | Freq: Two times a day (BID) | ORAL | 0 refills | Status: DC

## 2017-03-13 NOTE — Addendum Note (Signed)
Addended by: Nelva Nay on: 03/13/2017 03:11 PM   Modules accepted: Orders

## 2017-03-13 NOTE — Patient Instructions (Signed)
Take the antibiotic pill twice daily for 3 days.  Follow-up if your symptoms persist, worsen or recur.

## 2017-03-13 NOTE — Progress Notes (Signed)
    Lauren Francis 08/12/1945 [id number]        71 y.o.  G2P2 presents with several days of urinary frequency and suprapubic pressure before voiding.  No fever or chills.  No urgency low back pain nausea vomiting diarrhea constipation.  Vaginal discharge odor or irritation.  Past medical history,surgical history, problem list, medications, allergies, family history and social history were all reviewed and documented in the EPIC chart.  Directed ROS with pertinent positives and negatives documented in the history of present illness/assessment and plan.  Exam: Vitals:   03/13/17 1431  BP: 118/76   General appearance:  Normal Spine straight without CVA tenderness Abdomen soft nontender without masses guarding rebound  Assessment/Plan:  71 y.o. G2P2 with history and urine analysis consistent with early UTI.  Patient has been spending a lot of time in the hospital as her husband is terminal and she is not drinking fluids and voiding frequently as normal as well as not changing clothes on a regular basis.  She feels all of this is contributing to her early UTI.  Will treat with ciprofloxacin 250 mg twice daily times 3 days.  Follow-up if symptoms persist worsen or recur.    Anastasio Auerbach MD, 2:40 PM 03/13/2017

## 2017-03-15 LAB — URINE CULTURE
MICRO NUMBER:: 90002572
Result:: NO GROWTH
SPECIMEN QUALITY:: ADEQUATE

## 2017-03-15 LAB — URINALYSIS W MICROSCOPIC + REFLEX CULTURE
Bilirubin Urine: NEGATIVE
GLUCOSE, UA: NEGATIVE
Hgb urine dipstick: NEGATIVE
Hyaline Cast: NONE SEEN /LPF
Ketones, ur: NEGATIVE
NITRITES URINE, INITIAL: NEGATIVE
Protein, ur: NEGATIVE
RBC / HPF: NONE SEEN /HPF (ref 0–2)
Specific Gravity, Urine: 1.002 (ref 1.001–1.03)
pH: 6 (ref 5.0–8.0)

## 2017-03-15 LAB — CULTURE INDICATED

## 2017-05-17 ENCOUNTER — Ambulatory Visit: Payer: PPO | Admitting: Women's Health

## 2017-05-17 ENCOUNTER — Encounter: Payer: Self-pay | Admitting: Women's Health

## 2017-05-17 VITALS — BP 110/78 | Ht 63.0 in | Wt 146.0 lb

## 2017-05-17 DIAGNOSIS — F419 Anxiety disorder, unspecified: Secondary | ICD-10-CM | POA: Diagnosis not present

## 2017-05-17 DIAGNOSIS — R350 Frequency of micturition: Secondary | ICD-10-CM

## 2017-05-17 DIAGNOSIS — N898 Other specified noninflammatory disorders of vagina: Secondary | ICD-10-CM

## 2017-05-17 LAB — WET PREP FOR TRICH, YEAST, CLUE

## 2017-05-17 MED ORDER — ALPRAZOLAM 0.25 MG PO TABS: 0.2500 mg | ORAL_TABLET | ORAL | 1 refills | Status: AC | PRN

## 2017-05-17 NOTE — Progress Notes (Signed)
[id number]  

## 2017-05-17 NOTE — Progress Notes (Signed)
72 year old widowed WF G2 P2 presents with complaint of mild urinary frequency, urgency, pressure for several days. Was treated for UTI 02/2017 and wanted to make sure no further problem. Denies vaginal discharge, abdominal/back pain or fever.   Exam: Appears well, tearful, widow reports being very lonely. Reports good family support. No CVAT. Slight tenderness at suprapubic area. External genitalia within normal limits, no visible discharge or erythema, wet prep done with a Q-tip, negative. UA: Negative leukocytes, negative blood, no wbc's, no RBCs, 0-5 squamous epithelial use, no bacteria.  Normal exam  Plan: Reassurance given regarding wet prep, UA. Encouraged to continue drinking plenty of fluids, leisure activities, meeting presents for lunch, condolences given. Refill of Xanax 0.25 1/2 tablet when necessary for sleep, used less than one prescription in 2 years. Aware of addictive properties into use sparingly.

## 2017-05-18 LAB — URINALYSIS, COMPLETE W/RFL CULTURE
Bacteria, UA: NONE SEEN /HPF
Bilirubin Urine: NEGATIVE
Glucose, UA: NEGATIVE
HGB URINE DIPSTICK: NEGATIVE
Hyaline Cast: NONE SEEN /LPF
Ketones, ur: NEGATIVE
Leukocyte Esterase: NEGATIVE
Nitrites, Initial: NEGATIVE
PROTEIN: NEGATIVE
RBC / HPF: NONE SEEN /HPF (ref 0–2)
Specific Gravity, Urine: 1.005 (ref 1.001–1.03)
WBC, UA: NONE SEEN /HPF (ref 0–5)
pH: 5 (ref 5.0–8.0)

## 2017-05-18 LAB — NO CULTURE INDICATED

## 2017-07-10 DIAGNOSIS — Z0000 Encounter for general adult medical examination without abnormal findings: Secondary | ICD-10-CM | POA: Diagnosis not present

## 2017-07-10 DIAGNOSIS — F4321 Adjustment disorder with depressed mood: Secondary | ICD-10-CM | POA: Diagnosis not present

## 2017-07-10 DIAGNOSIS — Z1211 Encounter for screening for malignant neoplasm of colon: Secondary | ICD-10-CM | POA: Diagnosis not present

## 2017-07-10 DIAGNOSIS — Z136 Encounter for screening for cardiovascular disorders: Secondary | ICD-10-CM | POA: Diagnosis not present

## 2017-07-10 DIAGNOSIS — M8580 Other specified disorders of bone density and structure, unspecified site: Secondary | ICD-10-CM | POA: Diagnosis not present

## 2017-07-10 DIAGNOSIS — E785 Hyperlipidemia, unspecified: Secondary | ICD-10-CM | POA: Diagnosis not present

## 2017-07-10 DIAGNOSIS — E039 Hypothyroidism, unspecified: Secondary | ICD-10-CM | POA: Diagnosis not present

## 2017-07-10 DIAGNOSIS — E559 Vitamin D deficiency, unspecified: Secondary | ICD-10-CM | POA: Diagnosis not present

## 2017-09-13 DIAGNOSIS — E039 Hypothyroidism, unspecified: Secondary | ICD-10-CM | POA: Diagnosis not present

## 2017-10-23 DIAGNOSIS — Z1211 Encounter for screening for malignant neoplasm of colon: Secondary | ICD-10-CM | POA: Diagnosis not present

## 2017-10-23 DIAGNOSIS — K648 Other hemorrhoids: Secondary | ICD-10-CM | POA: Diagnosis not present

## 2017-10-23 DIAGNOSIS — D126 Benign neoplasm of colon, unspecified: Secondary | ICD-10-CM | POA: Diagnosis not present

## 2017-10-25 DIAGNOSIS — D126 Benign neoplasm of colon, unspecified: Secondary | ICD-10-CM | POA: Diagnosis not present

## 2017-10-25 DIAGNOSIS — Z1211 Encounter for screening for malignant neoplasm of colon: Secondary | ICD-10-CM | POA: Diagnosis not present

## 2017-10-31 DIAGNOSIS — M8589 Other specified disorders of bone density and structure, multiple sites: Secondary | ICD-10-CM | POA: Diagnosis not present

## 2017-10-31 DIAGNOSIS — M810 Age-related osteoporosis without current pathological fracture: Secondary | ICD-10-CM | POA: Diagnosis not present

## 2017-10-31 DIAGNOSIS — Z1231 Encounter for screening mammogram for malignant neoplasm of breast: Secondary | ICD-10-CM | POA: Diagnosis not present

## 2017-12-06 DIAGNOSIS — R1030 Lower abdominal pain, unspecified: Secondary | ICD-10-CM | POA: Diagnosis not present

## 2017-12-06 DIAGNOSIS — M8588 Other specified disorders of bone density and structure, other site: Secondary | ICD-10-CM | POA: Diagnosis not present

## 2018-01-08 DIAGNOSIS — M25551 Pain in right hip: Secondary | ICD-10-CM | POA: Diagnosis not present

## 2018-01-08 DIAGNOSIS — R262 Difficulty in walking, not elsewhere classified: Secondary | ICD-10-CM | POA: Diagnosis not present

## 2018-01-08 DIAGNOSIS — M79604 Pain in right leg: Secondary | ICD-10-CM | POA: Diagnosis not present

## 2018-01-08 DIAGNOSIS — R293 Abnormal posture: Secondary | ICD-10-CM | POA: Diagnosis not present

## 2018-01-09 DIAGNOSIS — M79604 Pain in right leg: Secondary | ICD-10-CM | POA: Diagnosis not present

## 2018-01-09 DIAGNOSIS — R293 Abnormal posture: Secondary | ICD-10-CM | POA: Diagnosis not present

## 2018-01-09 DIAGNOSIS — M25551 Pain in right hip: Secondary | ICD-10-CM | POA: Diagnosis not present

## 2018-01-09 DIAGNOSIS — R262 Difficulty in walking, not elsewhere classified: Secondary | ICD-10-CM | POA: Diagnosis not present

## 2018-01-11 DIAGNOSIS — M25551 Pain in right hip: Secondary | ICD-10-CM | POA: Diagnosis not present

## 2018-01-11 DIAGNOSIS — R293 Abnormal posture: Secondary | ICD-10-CM | POA: Diagnosis not present

## 2018-01-11 DIAGNOSIS — M79604 Pain in right leg: Secondary | ICD-10-CM | POA: Diagnosis not present

## 2018-01-11 DIAGNOSIS — R262 Difficulty in walking, not elsewhere classified: Secondary | ICD-10-CM | POA: Diagnosis not present

## 2018-01-17 DIAGNOSIS — R293 Abnormal posture: Secondary | ICD-10-CM | POA: Diagnosis not present

## 2018-01-17 DIAGNOSIS — M25551 Pain in right hip: Secondary | ICD-10-CM | POA: Diagnosis not present

## 2018-01-17 DIAGNOSIS — M79604 Pain in right leg: Secondary | ICD-10-CM | POA: Diagnosis not present

## 2018-01-17 DIAGNOSIS — R262 Difficulty in walking, not elsewhere classified: Secondary | ICD-10-CM | POA: Diagnosis not present

## 2018-01-19 DIAGNOSIS — R262 Difficulty in walking, not elsewhere classified: Secondary | ICD-10-CM | POA: Diagnosis not present

## 2018-01-19 DIAGNOSIS — R293 Abnormal posture: Secondary | ICD-10-CM | POA: Diagnosis not present

## 2018-01-19 DIAGNOSIS — M25551 Pain in right hip: Secondary | ICD-10-CM | POA: Diagnosis not present

## 2018-01-19 DIAGNOSIS — M79604 Pain in right leg: Secondary | ICD-10-CM | POA: Diagnosis not present

## 2018-01-22 DIAGNOSIS — M79604 Pain in right leg: Secondary | ICD-10-CM | POA: Diagnosis not present

## 2018-01-22 DIAGNOSIS — R262 Difficulty in walking, not elsewhere classified: Secondary | ICD-10-CM | POA: Diagnosis not present

## 2018-01-22 DIAGNOSIS — R293 Abnormal posture: Secondary | ICD-10-CM | POA: Diagnosis not present

## 2018-01-22 DIAGNOSIS — M25551 Pain in right hip: Secondary | ICD-10-CM | POA: Diagnosis not present

## 2018-01-24 DIAGNOSIS — M79604 Pain in right leg: Secondary | ICD-10-CM | POA: Diagnosis not present

## 2018-01-24 DIAGNOSIS — R262 Difficulty in walking, not elsewhere classified: Secondary | ICD-10-CM | POA: Diagnosis not present

## 2018-01-24 DIAGNOSIS — R293 Abnormal posture: Secondary | ICD-10-CM | POA: Diagnosis not present

## 2018-01-24 DIAGNOSIS — M25551 Pain in right hip: Secondary | ICD-10-CM | POA: Diagnosis not present

## 2018-01-29 DIAGNOSIS — M25551 Pain in right hip: Secondary | ICD-10-CM | POA: Diagnosis not present

## 2018-01-29 DIAGNOSIS — R293 Abnormal posture: Secondary | ICD-10-CM | POA: Diagnosis not present

## 2018-01-29 DIAGNOSIS — M79604 Pain in right leg: Secondary | ICD-10-CM | POA: Diagnosis not present

## 2018-01-29 DIAGNOSIS — R262 Difficulty in walking, not elsewhere classified: Secondary | ICD-10-CM | POA: Diagnosis not present

## 2018-01-31 DIAGNOSIS — R262 Difficulty in walking, not elsewhere classified: Secondary | ICD-10-CM | POA: Diagnosis not present

## 2018-01-31 DIAGNOSIS — R293 Abnormal posture: Secondary | ICD-10-CM | POA: Diagnosis not present

## 2018-01-31 DIAGNOSIS — M79604 Pain in right leg: Secondary | ICD-10-CM | POA: Diagnosis not present

## 2018-01-31 DIAGNOSIS — M25551 Pain in right hip: Secondary | ICD-10-CM | POA: Diagnosis not present

## 2018-02-05 DIAGNOSIS — M25551 Pain in right hip: Secondary | ICD-10-CM | POA: Diagnosis not present

## 2018-02-05 DIAGNOSIS — M79604 Pain in right leg: Secondary | ICD-10-CM | POA: Diagnosis not present

## 2018-02-05 DIAGNOSIS — R293 Abnormal posture: Secondary | ICD-10-CM | POA: Diagnosis not present

## 2018-02-05 DIAGNOSIS — R262 Difficulty in walking, not elsewhere classified: Secondary | ICD-10-CM | POA: Diagnosis not present

## 2018-02-07 DIAGNOSIS — M79604 Pain in right leg: Secondary | ICD-10-CM | POA: Diagnosis not present

## 2018-02-07 DIAGNOSIS — R293 Abnormal posture: Secondary | ICD-10-CM | POA: Diagnosis not present

## 2018-02-07 DIAGNOSIS — M25551 Pain in right hip: Secondary | ICD-10-CM | POA: Diagnosis not present

## 2018-02-07 DIAGNOSIS — R262 Difficulty in walking, not elsewhere classified: Secondary | ICD-10-CM | POA: Diagnosis not present

## 2018-02-12 DIAGNOSIS — R262 Difficulty in walking, not elsewhere classified: Secondary | ICD-10-CM | POA: Diagnosis not present

## 2018-02-12 DIAGNOSIS — M79604 Pain in right leg: Secondary | ICD-10-CM | POA: Diagnosis not present

## 2018-02-12 DIAGNOSIS — R293 Abnormal posture: Secondary | ICD-10-CM | POA: Diagnosis not present

## 2018-02-12 DIAGNOSIS — M25551 Pain in right hip: Secondary | ICD-10-CM | POA: Diagnosis not present

## 2018-02-14 DIAGNOSIS — M79604 Pain in right leg: Secondary | ICD-10-CM | POA: Diagnosis not present

## 2018-02-14 DIAGNOSIS — M25551 Pain in right hip: Secondary | ICD-10-CM | POA: Diagnosis not present

## 2018-02-14 DIAGNOSIS — R262 Difficulty in walking, not elsewhere classified: Secondary | ICD-10-CM | POA: Diagnosis not present

## 2018-02-14 DIAGNOSIS — R293 Abnormal posture: Secondary | ICD-10-CM | POA: Diagnosis not present

## 2018-07-18 DIAGNOSIS — E039 Hypothyroidism, unspecified: Secondary | ICD-10-CM | POA: Diagnosis not present

## 2018-07-18 DIAGNOSIS — E559 Vitamin D deficiency, unspecified: Secondary | ICD-10-CM | POA: Diagnosis not present

## 2018-07-18 DIAGNOSIS — Z0000 Encounter for general adult medical examination without abnormal findings: Secondary | ICD-10-CM | POA: Diagnosis not present

## 2018-07-18 DIAGNOSIS — E785 Hyperlipidemia, unspecified: Secondary | ICD-10-CM | POA: Diagnosis not present

## 2018-07-18 DIAGNOSIS — Z1389 Encounter for screening for other disorder: Secondary | ICD-10-CM | POA: Diagnosis not present

## 2018-08-14 DIAGNOSIS — E039 Hypothyroidism, unspecified: Secondary | ICD-10-CM | POA: Diagnosis not present

## 2018-08-14 DIAGNOSIS — E785 Hyperlipidemia, unspecified: Secondary | ICD-10-CM | POA: Diagnosis not present

## 2018-08-14 DIAGNOSIS — E559 Vitamin D deficiency, unspecified: Secondary | ICD-10-CM | POA: Diagnosis not present

## 2018-08-18 ENCOUNTER — Other Ambulatory Visit: Payer: Self-pay | Admitting: *Deleted

## 2018-08-18 DIAGNOSIS — Z20822 Contact with and (suspected) exposure to covid-19: Secondary | ICD-10-CM

## 2018-08-20 LAB — NOVEL CORONAVIRUS, NAA: SARS-CoV-2, NAA: NOT DETECTED

## 2018-10-08 DIAGNOSIS — E039 Hypothyroidism, unspecified: Secondary | ICD-10-CM | POA: Diagnosis not present

## 2018-10-29 DIAGNOSIS — L738 Other specified follicular disorders: Secondary | ICD-10-CM | POA: Diagnosis not present

## 2018-10-29 DIAGNOSIS — L821 Other seborrheic keratosis: Secondary | ICD-10-CM | POA: Diagnosis not present

## 2018-10-29 DIAGNOSIS — B351 Tinea unguium: Secondary | ICD-10-CM | POA: Diagnosis not present

## 2018-11-02 DIAGNOSIS — Z1231 Encounter for screening mammogram for malignant neoplasm of breast: Secondary | ICD-10-CM | POA: Diagnosis not present

## 2018-11-30 DIAGNOSIS — M238X2 Other internal derangements of left knee: Secondary | ICD-10-CM | POA: Diagnosis not present

## 2018-11-30 DIAGNOSIS — M25562 Pain in left knee: Secondary | ICD-10-CM | POA: Diagnosis not present

## 2018-12-11 DIAGNOSIS — Z23 Encounter for immunization: Secondary | ICD-10-CM | POA: Diagnosis not present

## 2018-12-20 ENCOUNTER — Encounter: Payer: Self-pay | Admitting: Gynecology

## 2019-01-23 DIAGNOSIS — M25562 Pain in left knee: Secondary | ICD-10-CM | POA: Diagnosis not present

## 2019-01-23 DIAGNOSIS — M1712 Unilateral primary osteoarthritis, left knee: Secondary | ICD-10-CM | POA: Diagnosis not present

## 2019-01-23 DIAGNOSIS — M2392 Unspecified internal derangement of left knee: Secondary | ICD-10-CM | POA: Diagnosis not present

## 2019-04-01 ENCOUNTER — Ambulatory Visit: Payer: Medicare Other | Attending: Internal Medicine

## 2019-04-01 DIAGNOSIS — Z20822 Contact with and (suspected) exposure to covid-19: Secondary | ICD-10-CM

## 2019-04-02 LAB — NOVEL CORONAVIRUS, NAA: SARS-CoV-2, NAA: NOT DETECTED

## 2019-05-16 DIAGNOSIS — M25562 Pain in left knee: Secondary | ICD-10-CM | POA: Diagnosis not present

## 2019-05-16 DIAGNOSIS — M1712 Unilateral primary osteoarthritis, left knee: Secondary | ICD-10-CM | POA: Diagnosis not present

## 2019-08-14 DIAGNOSIS — Z0120 Encounter for dental examination and cleaning without abnormal findings: Secondary | ICD-10-CM | POA: Diagnosis not present

## 2019-08-15 DIAGNOSIS — Z0000 Encounter for general adult medical examination without abnormal findings: Secondary | ICD-10-CM | POA: Diagnosis not present

## 2019-08-15 DIAGNOSIS — E785 Hyperlipidemia, unspecified: Secondary | ICD-10-CM | POA: Diagnosis not present

## 2019-08-15 DIAGNOSIS — E039 Hypothyroidism, unspecified: Secondary | ICD-10-CM | POA: Diagnosis not present

## 2019-08-15 DIAGNOSIS — E559 Vitamin D deficiency, unspecified: Secondary | ICD-10-CM | POA: Diagnosis not present

## 2019-09-11 DIAGNOSIS — H43813 Vitreous degeneration, bilateral: Secondary | ICD-10-CM | POA: Diagnosis not present

## 2019-10-04 DIAGNOSIS — E039 Hypothyroidism, unspecified: Secondary | ICD-10-CM | POA: Diagnosis not present

## 2019-11-06 DIAGNOSIS — Z1231 Encounter for screening mammogram for malignant neoplasm of breast: Secondary | ICD-10-CM | POA: Diagnosis not present

## 2019-11-06 DIAGNOSIS — M8589 Other specified disorders of bone density and structure, multiple sites: Secondary | ICD-10-CM | POA: Diagnosis not present

## 2019-12-02 DIAGNOSIS — Z20828 Contact with and (suspected) exposure to other viral communicable diseases: Secondary | ICD-10-CM | POA: Diagnosis not present

## 2019-12-17 DIAGNOSIS — E039 Hypothyroidism, unspecified: Secondary | ICD-10-CM | POA: Diagnosis not present

## 2019-12-23 ENCOUNTER — Ambulatory Visit: Payer: Medicare Other | Attending: Internal Medicine

## 2019-12-23 DIAGNOSIS — Z23 Encounter for immunization: Secondary | ICD-10-CM

## 2019-12-23 NOTE — Progress Notes (Signed)
   Covid-19 Vaccination Clinic  Name:  Lauren Francis    MRN: [id number] DOB: 04/12/1945  12/23/2019  [name] was observed post Covid-19 immunization for 15 minutes without incident. She was provided with Vaccine Information Sheet and instruction to access the V-Safe system.   [name] was instructed to call 911 with any severe reactions post vaccine: Marland Kitchen Difficulty breathing  . Swelling of face and throat  . A fast heartbeat  . A bad rash all over body  . Dizziness and weakness

## 2020-02-18 DIAGNOSIS — Z0120 Encounter for dental examination and cleaning without abnormal findings: Secondary | ICD-10-CM | POA: Diagnosis not present

## 2020-03-02 DIAGNOSIS — Z1283 Encounter for screening for malignant neoplasm of skin: Secondary | ICD-10-CM | POA: Diagnosis not present

## 2020-03-02 DIAGNOSIS — D225 Melanocytic nevi of trunk: Secondary | ICD-10-CM | POA: Diagnosis not present

## 2020-09-17 DIAGNOSIS — M25572 Pain in left ankle and joints of left foot: Secondary | ICD-10-CM | POA: Diagnosis not present

## 2020-09-18 DIAGNOSIS — E559 Vitamin D deficiency, unspecified: Secondary | ICD-10-CM | POA: Diagnosis not present

## 2020-09-18 DIAGNOSIS — Z0000 Encounter for general adult medical examination without abnormal findings: Secondary | ICD-10-CM | POA: Diagnosis not present

## 2020-09-18 DIAGNOSIS — Z1159 Encounter for screening for other viral diseases: Secondary | ICD-10-CM | POA: Diagnosis not present

## 2020-09-18 DIAGNOSIS — E785 Hyperlipidemia, unspecified: Secondary | ICD-10-CM | POA: Diagnosis not present

## 2020-09-18 DIAGNOSIS — E039 Hypothyroidism, unspecified: Secondary | ICD-10-CM | POA: Diagnosis not present

## 2020-09-18 DIAGNOSIS — R7301 Impaired fasting glucose: Secondary | ICD-10-CM | POA: Diagnosis not present

## 2020-11-19 DIAGNOSIS — Z1231 Encounter for screening mammogram for malignant neoplasm of breast: Secondary | ICD-10-CM | POA: Diagnosis not present

## 2020-11-26 DIAGNOSIS — R922 Inconclusive mammogram: Secondary | ICD-10-CM | POA: Diagnosis not present

## 2020-11-26 DIAGNOSIS — R928 Other abnormal and inconclusive findings on diagnostic imaging of breast: Secondary | ICD-10-CM | POA: Diagnosis not present

## 2021-02-23 DIAGNOSIS — H43813 Vitreous degeneration, bilateral: Secondary | ICD-10-CM | POA: Diagnosis not present

## 2021-03-22 ENCOUNTER — Ambulatory Visit: Payer: Self-pay | Admitting: Surgery

## 2021-03-22 DIAGNOSIS — K643 Fourth degree hemorrhoids: Secondary | ICD-10-CM | POA: Diagnosis not present

## 2021-03-22 DIAGNOSIS — K644 Residual hemorrhoidal skin tags: Secondary | ICD-10-CM | POA: Diagnosis not present

## 2021-03-22 DIAGNOSIS — Z86010 Personal history of colonic polyps: Secondary | ICD-10-CM | POA: Diagnosis not present

## 2021-04-21 DIAGNOSIS — Z1283 Encounter for screening for malignant neoplasm of skin: Secondary | ICD-10-CM | POA: Diagnosis not present

## 2021-04-21 DIAGNOSIS — B0089 Other herpesviral infection: Secondary | ICD-10-CM | POA: Diagnosis not present

## 2021-05-26 ENCOUNTER — Other Ambulatory Visit: Payer: Self-pay | Admitting: Surgery

## 2021-05-26 DIAGNOSIS — K644 Residual hemorrhoidal skin tags: Secondary | ICD-10-CM | POA: Diagnosis not present

## 2021-05-26 DIAGNOSIS — K643 Fourth degree hemorrhoids: Secondary | ICD-10-CM | POA: Diagnosis not present

## 2021-05-26 DIAGNOSIS — K649 Unspecified hemorrhoids: Secondary | ICD-10-CM | POA: Diagnosis not present

## 2021-09-06 ENCOUNTER — Other Ambulatory Visit: Payer: Self-pay

## 2021-09-06 ENCOUNTER — Ambulatory Visit: Payer: Medicare Other | Attending: Surgery

## 2021-09-06 DIAGNOSIS — M62838 Other muscle spasm: Secondary | ICD-10-CM | POA: Diagnosis not present

## 2021-09-06 DIAGNOSIS — R279 Unspecified lack of coordination: Secondary | ICD-10-CM | POA: Diagnosis not present

## 2021-09-06 DIAGNOSIS — M6281 Muscle weakness (generalized): Secondary | ICD-10-CM | POA: Diagnosis not present

## 2021-09-06 NOTE — Therapy (Signed)
OUTPATIENT PHYSICAL THERAPY FEMALE PELVIC EVALUATION   Patient Name: Lauren Francis MRN: [id number] DOB:1945/10/17, 76 y.o., female Today's Date: 09/06/2021   PT End of Session - 09/06/21 1100     Visit Number 1    Date for PT Re-Evaluation 11/01/21    Authorization Type BCBS Medicare    Progress Note Due on Visit 10    PT Start Time 1015    PT Stop Time 1100    PT Time Calculation (min) 45 min    Activity Tolerance Patient tolerated treatment well    Behavior During Therapy WFL for tasks assessed/performed             Past Medical History:  Diagnosis Date   Back pain    Contact lens/glasses fitting    wears contacts or glasses   Fracture acetabulum-closed, left, sequela    Osteopenia    RBBB    Spondylolisthesis    Past Surgical History:  Procedure Laterality Date   BACK SURGERY  2012   lumb fusion   CARPAL TUNNEL RELEASE Right 01/04/2013   Procedure: CARPAL TUNNEL RELEASE;  Surgeon: Nicki Reaper, MD;  [facility];  Service: Orthopedics;  Laterality: Right;   CESAREAN SECTION     COLONOSCOPY     HAND TENDON SURGERY Right    tendon release   PLANTAR FASCIA SURGERY  1998   lt   tendonitis  2006   lt hand   Patient Active Problem List   Diagnosis Date Noted   Hypercholesteremia 08/15/2011   Hypothyroid 08/15/2011   Osteopenia 08/15/2011   Internal hemorrhoids with thrombosis & prolapse 11/01/2010    PCP: Laurann Montana, MD  REFERRING PROVIDER: Karie Soda, MD  REFERRING DIAG:  Z86.010 (ICD-10-CM) - Personal history of colonic polyps  K64.4 (ICD-10-CM) - Residual hemorrhoidal skin tags  K64.3 (ICD-10-CM) - Fourth degree hemorrhoids     THERAPY DIAG:  Unspecified lack of coordination  Muscle weakness (generalized)  Other muscle spasm  Rationale for Evaluation and Treatment Rehabilitation  ONSET DATE: 05/26/21  SUBJECTIVE:                                                                                                                                                                                            SUBJECTIVE STATEMENT: Pt had hemorrhoidectomy on 05/26/21. She started having loose stools afterwards and she would end up voiding fecal matter every time she urinated. She states that this has improved some and does not happen every single time, but it does still happen. She does report some fecal urgency. She states that she has to go several times before  she is completely empty. She states that bowel movements used to be every morning right after she drank her coffee and well formed. She now has soft bowel movements that are not well formed and comes in sections. She feels like if there is any amount of feces there, she feels pressure and urgency.   Fluid intake: Yes: states not enough water; caffeine in the morning - 2 cups     PAIN:  Are you having pain? No   PRECAUTIONS: None  WEIGHT BEARING RESTRICTIONS No  FALLS:  Has patient fallen in last 6 months? No  LIVING ENVIRONMENT: Lives with: lives with their family Lives in: House/apartment   OCCUPATION: retired   PLOF: Independent  PATIENT GOALS She would like to feel confident enough in bowels that she did not have to wear a pad; gain control over bowels  PERTINENT HISTORY:  Hemorrhoidectomy 05/26/21 Sexual abuse: No  BOWEL MOVEMENT Pain with bowel movement: No Type of bowel movement:Frequency several times in the morning and Strain No Fully empty rectum: No Leakage: Yes: having a lot of gas and sometimes she will leak when relieving gas Pads: Yes: daily Fiber supplement: No - not doing this anymore or focusing on fiber rich foods due to large increase in uncomfortable gas    URINATION Pain with urination: No Fully empty bladder: Yes: - Stream:  WNL Urgency: No Frequency: more often in the morning with coffee, then 2-3 hours; no nocturia, occasionally 1x/night Leakage:  none Pads: Yes: daily  INTERCOURSE Pain with  intercourse:  not currently sexually active, but became painful after menopause Ability to have vaginal penetration: unsure Climax: Does not perform   PREGNANCY Vaginal deliveries 1 Tearing Yes: - C-section deliveries 1 Currently pregnant No    OBJECTIVE:    COGNITION:  Overall cognitive status: Within functional limits for tasks assessed     SENSATION:  Light touch: Appears intact  Proprioception: Appears intact   GAIT:  Comments: WNL               POSTURE: rounded shoulders, forward head, and posterior pelvic tilt    PALPATION:   General  Not assessed                External Perineal Exam External hemorrhoids visible                             Internal Pelvic Floor Posterior scar tissue present inferior to coccyx; increased pelvic floor muscle tone/restriction with reproduction of heaviness sensation upon palpation; restriction/tone worse Lt>Rt  Patient confirms identification and approves PT to assess internal pelvic floor and treatment Yes  No emotional/communication barriers or cognitive limitation. Patient is motivated to learn. Patient understands and agrees with treatment goals and plan. PT explains patient will be examined in standing, sitting, and lying down to see how their muscles and joints work. When they are ready, they will be asked to remove their underwear so PT can examine their perineum. The patient is also given the option of providing their own chaperone as one is not provided in our facility. The patient also has the right and is explained the right to defer or refuse any part of the evaluation or treatment including the internal exam. With the patient's consent, PT will use one gloved finger to gently assess the muscles of the pelvic floor, seeing how well it contracts and relaxes and if there is muscle symmetry. After, the patient will get  dressed and PT and patient will discuss exam findings and plan of care. PT and patient discuss plan of care,  schedule, attendance policy and HEP activities.   PELVIC MMT: 2/5 strength 3 second endurance  4 repetitions          TONE: High  PROLAPSE: No rectal prolapse noted this session  TODAY'S TREATMENT 09/06/21 EVAL  Neuromuscular re-education: Pelvic floor contraction training: Pelvic floor contraction training with internal rectal feedback Self-care: Squatty potty Relaxed toileting mechanics Importance of relaxation training Fiber intake    PATIENT EDUCATION:  Education details: See above self-care Person educated: Patient Education method: Explanation, Demonstration, Tactile cues, Verbal cues, and Handouts Education comprehension: verbalized understanding   HOME EXERCISE PROGRAM: [id number]  ASSESSMENT:  CLINICAL IMPRESSION: Patient is a 76 y.o. female who was seen today for physical therapy evaluation and treatment for fecal urgency/incontinence. Exam findings with internal rectal assessment notable for pelvic floor weakness 2/5, decreased endurance 3 seconds, repeat contractions 4, posterior scar tissue restriction, and increased muscular tone. Signs and symptoms are most consistent with rectal scar tissue, posterior pelvic floor restriction/increased tone, and pelvic floor weakness. Initial treatment included pelvic floor contraction training with emphasis on relaxation, improved awareness of pelvic floor clenching, and relaxed toileting mechanics. She will benefit from skilled PT intervention in order to reduce fecal urgency/incontinence, improve pelvic floor tone/scar tissue restriction, and improve QOL.    OBJECTIVE IMPAIRMENTS decreased activity tolerance, decreased coordination, decreased endurance, decreased mobility, decreased strength, hypomobility, increased fascial restrictions, increased muscle spasms, impaired tone, and pain.   ACTIVITY LIMITATIONS continence  PARTICIPATION LIMITATIONS: community activity  PERSONAL FACTORS 1 comorbidity: hemorrhoidectomy   are also affecting patient's functional outcome.   REHAB POTENTIAL: Good  CLINICAL DECISION MAKING: Stable/uncomplicated  EVALUATION COMPLEXITY: Low   GOALS: Goals reviewed with patient? Yes  SHORT TERM GOALS: Target date: 10/04/2021  Pt will be independent with HEP.   Baseline: Goal status: INITIAL  2.  Pt will be independent with use of squatty potty, relaxed toileting mechanics, and improved bowel movement techniques in order to increase ease of bowel movements and complete evacuation.   Baseline:  Goal status: INITIAL   LONG TERM GOALS: Target date: 11/01/2021   Pt will be independent with advanced HEP.   Baseline:  Goal status: INITIAL  2.  Pt will demonstrate normal pelvic floor muscle tone and A/ROM, able to achieve 4/5 strength with contractions and 10 sec endurance, in order to provide appropriate lumbopelvic support in functional activities.   Baseline:  Goal status: INITIAL  3.  Pt will be able to urinate without have bowel movement with loss of control.  Baseline:  Goal status: INITIAL  4.  Pt will report no episodes of fecal incontinence in order to improve confidence in community activities and personal hygiene.   Baseline:  Goal status: INITIAL  5.  Pt will demonstrate normal pelvic floor tone and rectal scar tissue mobility.  Baseline:  Goal status: INITIAL    PLAN: PT FREQUENCY: 1x/week  PT DURATION: 8 weeks  PLANNED INTERVENTIONS: Therapeutic exercises, Therapeutic activity, Neuromuscular re-education, Balance training, Gait training, Patient/Family education, Joint mobilization, Dry Needling, Biofeedback, and Manual therapy  PLAN FOR NEXT SESSION: bowel retraining; check in with fiber addition; manual techniques to help improve scar tissue restriction and pelvic floor tone; hip mobility.    Julio Alm, PT, DPT06/26/231:47 PM

## 2021-09-23 ENCOUNTER — Ambulatory Visit: Payer: Medicare Other | Attending: Surgery

## 2021-09-23 DIAGNOSIS — M6281 Muscle weakness (generalized): Secondary | ICD-10-CM | POA: Diagnosis not present

## 2021-09-23 DIAGNOSIS — R279 Unspecified lack of coordination: Secondary | ICD-10-CM | POA: Diagnosis not present

## 2021-09-23 DIAGNOSIS — M62838 Other muscle spasm: Secondary | ICD-10-CM | POA: Insufficient documentation

## 2021-09-23 NOTE — Therapy (Signed)
OUTPATIENT PHYSICAL THERAPY TREATMENT NOTE   Patient Name: Lauren Francis MRN: [id number] DOB:03/27/1945, 76 y.o., female Today's Date: 09/23/2021  PCP: Harlan Stains, MD REFERRING PROVIDER: Michael Boston, MD  END OF SESSION:   PT End of Session - 09/23/21 1017     Visit Number 2    Date for PT Re-Evaluation 11/01/21    Authorization Type BCBS Medicare    Progress Note Due on Visit 10    PT Start Time 1015    PT Stop Time 1055    PT Time Calculation (min) 40 min    Activity Tolerance Patient tolerated treatment well    Behavior During Therapy WFL for tasks assessed/performed             Past Medical History:  Diagnosis Date   Back pain    Contact lens/glasses fitting    wears contacts or glasses   Fracture acetabulum-closed, left, sequela    Osteopenia    RBBB    Spondylolisthesis    Past Surgical History:  Procedure Laterality Date   BACK SURGERY  2012   lumb fusion   CARPAL TUNNEL RELEASE Right 01/04/2013   Procedure: CARPAL TUNNEL RELEASE;  Surgeon: Wynonia Sours, MD;  Location: [place];  Service: Orthopedics;  Laterality: Right;   CESAREAN SECTION     COLONOSCOPY     HAND TENDON SURGERY Right    tendon release   PLANTAR FASCIA SURGERY  1998   lt   tendonitis  2006   lt hand   Patient Active Problem List   Diagnosis Date Noted   Hypercholesteremia 08/15/2011   Hypothyroid 08/15/2011   Osteopenia 08/15/2011   Internal hemorrhoids with thrombosis & prolapse 11/01/2010    REFERRING DIAG: Z86.010 (ICD-10-CM) - Personal history of colonic polyps K64.4 (ICD-10-CM) - Residual hemorrhoidal skin tags K64.3 (ICD-10-CM) - Fourth degree hemorrhoids  THERAPY DIAG:  Unspecified lack of coordination  Muscle weakness (generalized)  Other muscle spasm  Rationale for Evaluation and Treatment Rehabilitation  PERTINENT HISTORY: Hemorrhoidectomy 05/26/21  PRECAUTIONS: NA  SUBJECTIVE: Patient states that she can tell an improvement.  She has been working on exercises and ordered psyllium husk/started taking. She has not had as much break through of fecal incontinence. She is feeling less pressure.   PAIN:  Are you having pain? No   SUBJECTIVE 09/06/21:                                                                                                                                                                                            SUBJECTIVE STATEMENT: Pt had hemorrhoidectomy on 05/26/21. She started having loose  stools afterwards and she would end up voiding fecal matter every time she urinated. She states that this has improved some and does not happen every single time, but it does still happen. She does report some fecal urgency. She states that she has to go several times before she is completely empty. She states that bowel movements used to be every morning right after she drank her coffee and well formed. She now has soft bowel movements that are not well formed and comes in sections. She feels like if there is any amount of feces there, she feels pressure and urgency.    Fluid intake: Yes: states not enough water; caffeine in the morning - 2 cups       PAIN:  Are you having pain? No     PATIENT GOALS She would like to feel confident enough in bowels that she did not have to wear a pad; gain control over bowels    BOWEL MOVEMENT Pain with bowel movement: No Type of bowel movement:Frequency several times in the morning and Strain No Fully empty rectum: No Leakage: Yes: having a lot of gas and sometimes she will leak when relieving gas Pads: Yes: daily Fiber supplement: No - not doing this anymore or focusing on fiber rich foods due to large increase in uncomfortable gas       URINATION Pain with urination: No Fully empty bladder: Yes: - Stream:  WNL Urgency: No Frequency: more often in the morning with coffee, then 2-3 hours; no nocturia, occasionally 1x/night Leakage:  none Pads: Yes: daily    INTERCOURSE Pain with intercourse:  not currently sexually active, but became painful after menopause Ability to have vaginal penetration: unsure Climax: Does not perform     PREGNANCY Vaginal deliveries 1 Tearing Yes: - C-section deliveries 1 Currently pregnant No       OBJECTIVE 09/06/21:      COGNITION:            Overall cognitive status: Within functional limits for tasks assessed                          SENSATION:            Light touch: Appears intact            Proprioception: Appears intact     GAIT:   Comments: WNL                POSTURE: rounded shoulders, forward head, and posterior pelvic tilt      PALPATION:   General  Not assessed                 External Perineal Exam External hemorrhoids visible                             Internal Pelvic Floor Posterior scar tissue present inferior to coccyx; increased pelvic floor muscle tone/restriction with reproduction of heaviness sensation upon palpation; restriction/tone worse Lt>Rt   Patient confirms identification and approves PT to assess internal pelvic floor and treatment Yes   No emotional/communication barriers or cognitive limitation. Patient is motivated to learn. Patient understands and agrees with treatment goals and plan. PT explains patient will be examined in standing, sitting, and lying down to see how their muscles and joints work. When they are ready, they will be asked to remove their underwear so PT can examine their perineum. The patient is  also given the option of providing their own chaperone as one is not provided in our facility. The patient also has the right and is explained the right to defer or refuse any part of the evaluation or treatment including the internal exam. With the patient's consent, PT will use one gloved finger to gently assess the muscles of the pelvic floor, seeing how well it contracts and relaxes and if there is muscle symmetry. After, the patient will get dressed and  PT and patient will discuss exam findings and plan of care. PT and patient discuss plan of care, schedule, attendance policy and HEP activities.     PELVIC MMT: 2/5 strength 3 second endurance  4 repetitions            TONE: High   PROLAPSE: No rectal prolapse noted this session   TODAY'S TREATMENT 09/23/21 Manual: Myofascial release: Bowel massage Internal pelvic floor techniques: No emotional/communication barriers or cognitive limitation. Patient is motivated to learn. Patient understands and agrees with treatment goals and plan. PT explains patient will be examined in standing, sitting, and lying down to see how their muscles and joints work. When they are ready, they will be asked to remove their underwear so PT can examine their perineum. The patient is also given the option of providing their own chaperone as one is not provided in our facility. The patient also has the right and is explained the right to defer or refuse any part of the evaluation or treatment including the internal exam. With the patient's consent, PT will use one gloved finger to gently assess the muscles of the pelvic floor, seeing how well it contracts and relaxes and if there is muscle symmetry. After, the patient will get dressed and PT and patient will discuss exam findings and plan of care. PT and patient discuss plan of care, schedule, attendance policy and HEP activities. Rectal scar tissue release Rectal pelvic floor release Exercises: Stretches/mobility: Open books 10x bil Bent knee fall outs 5 x 10 sec bil Kneeling hip flexor stretch 60 sec bil  TREATMENT 09/06/21 EVAL  Neuromuscular re-education: Pelvic floor contraction training: Pelvic floor contraction training with internal rectal feedback Self-care: Squatty potty Relaxed toileting mechanics Importance of relaxation training Fiber intake       PATIENT EDUCATION:  Education details: Exercise progressions  Person educated:  Patient Education method: Explanation, Demonstration, Tactile cues, Verbal cues, and Handouts Education comprehension: verbalized understanding     HOME EXERCISE PROGRAM: [id number]   ASSESSMENT:   CLINICAL IMPRESSION: Patient has already seen improvements in overall condition with less fecal incontinence. She feels like her bowel movements are less soft so there is less pressure in rectum with emptying more completely. Believe some of the pressure is still due to scar tissue restriction and posterior muscle tension, therefore rectal release of scar tissue and muscles performed today with good tolerance. She had reproduction of abdominal pressure when palpating/releasing pelvic floor tension. She did well with bowel massage. Good tolerance to all mobility exercises focused on improving abdominal wall mobility. She will benefit from skilled PT intervention in order to reduce fecal urgency/incontinence, improve pelvic floor tone/scar tissue restriction, and improve QOL.      OBJECTIVE IMPAIRMENTS decreased activity tolerance, decreased coordination, decreased endurance, decreased mobility, decreased strength, hypomobility, increased fascial restrictions, increased muscle spasms, impaired tone, and pain.    ACTIVITY LIMITATIONS continence   PARTICIPATION LIMITATIONS: community activity   PERSONAL FACTORS 1 comorbidity: hemorrhoidectomy  are also affecting patient's functional outcome.  REHAB POTENTIAL: Good   CLINICAL DECISION MAKING: Stable/uncomplicated   EVALUATION COMPLEXITY: Low     GOALS: Goals reviewed with patient? Yes   SHORT TERM GOALS: Target date: 10/04/2021   Pt will be independent with HEP.    Baseline: Goal status: INITIAL   2.  Pt will be independent with use of squatty potty, relaxed toileting mechanics, and improved bowel movement techniques in order to increase ease of bowel movements and complete evacuation.    Baseline:  Goal status: INITIAL     LONG TERM  GOALS: Target date: 11/01/2021    Pt will be independent with advanced HEP.    Baseline:  Goal status: INITIAL   2.  Pt will demonstrate normal pelvic floor muscle tone and A/ROM, able to achieve 4/5 strength with contractions and 10 sec endurance, in order to provide appropriate lumbopelvic support in functional activities.    Baseline:  Goal status: INITIAL   3.  Pt will be able to urinate without have bowel movement with loss of control.  Baseline:  Goal status: INITIAL   4.  Pt will report no episodes of fecal incontinence in order to improve confidence in community activities and personal hygiene.    Baseline:  Goal status: INITIAL   5.  Pt will demonstrate normal pelvic floor tone and rectal scar tissue mobility.  Baseline:  Goal status: INITIAL       PLAN: PT FREQUENCY: 1x/week   PT DURATION: 8 weeks   PLANNED INTERVENTIONS: Therapeutic exercises, Therapeutic activity, Neuromuscular re-education, Balance training, Gait training, Patient/Family education, Joint mobilization, Dry Needling, Biofeedback, and Manual therapy   PLAN FOR NEXT SESSION: progress abdominal/hip mobility exercises; begin gentle strengthening; manual techniques as needed.   Heather Roberts, PT, DPT07/13/2310:58 AM

## 2021-09-28 DIAGNOSIS — E785 Hyperlipidemia, unspecified: Secondary | ICD-10-CM | POA: Diagnosis not present

## 2021-09-28 DIAGNOSIS — Z0000 Encounter for general adult medical examination without abnormal findings: Secondary | ICD-10-CM | POA: Diagnosis not present

## 2021-09-28 DIAGNOSIS — E039 Hypothyroidism, unspecified: Secondary | ICD-10-CM | POA: Diagnosis not present

## 2021-09-28 DIAGNOSIS — M8588 Other specified disorders of bone density and structure, other site: Secondary | ICD-10-CM | POA: Diagnosis not present

## 2021-09-29 ENCOUNTER — Ambulatory Visit: Payer: Medicare Other

## 2021-09-29 DIAGNOSIS — R279 Unspecified lack of coordination: Secondary | ICD-10-CM

## 2021-09-29 DIAGNOSIS — M6281 Muscle weakness (generalized): Secondary | ICD-10-CM

## 2021-09-29 DIAGNOSIS — M62838 Other muscle spasm: Secondary | ICD-10-CM

## 2021-09-29 NOTE — Therapy (Signed)
OUTPATIENT PHYSICAL THERAPY TREATMENT NOTE   Patient Name: Lauren Francis MRN: [id number] DOB:03/15/1945, 76 y.o., female Today's Date: 09/29/2021  PCP: Harlan Stains, MD REFERRING PROVIDER: Michael Boston, MD  END OF SESSION:   PT End of Session - 09/29/21 1015     Visit Number 3    Date for PT Re-Evaluation 11/01/21    Authorization Type BCBS Medicare    Progress Note Due on Visit 10    PT Start Time 1015    PT Stop Time 1055    PT Time Calculation (min) 40 min    Activity Tolerance Patient tolerated treatment well    Behavior During Therapy WFL for tasks assessed/performed              Past Medical History:  Diagnosis Date   Back pain    Contact lens/glasses fitting    wears contacts or glasses   Fracture acetabulum-closed, left, sequela    Osteopenia    RBBB    Spondylolisthesis    Past Surgical History:  Procedure Laterality Date   BACK SURGERY  2012   lumb fusion   CARPAL TUNNEL RELEASE Right 01/04/2013   Procedure: CARPAL TUNNEL RELEASE;  Surgeon: Wynonia Sours, MD;  Location: [facility];  Service: Orthopedics;  Laterality: Right;   CESAREAN SECTION     COLONOSCOPY     HAND TENDON SURGERY Right    tendon release   PLANTAR FASCIA SURGERY  1998   lt   tendonitis  2006   lt hand   Patient Active Problem List   Diagnosis Date Noted   Hypercholesteremia 08/15/2011   Hypothyroid 08/15/2011   Osteopenia 08/15/2011   Internal hemorrhoids with thrombosis & prolapse 11/01/2010    REFERRING DIAG: Z86.010 (ICD-10-CM) - Personal history of colonic polyps K64.4 (ICD-10-CM) - Residual hemorrhoidal skin tags K64.3 (ICD-10-CM) - Fourth degree hemorrhoids  THERAPY DIAG:  Unspecified lack of coordination  Muscle weakness (generalized)  Other muscle spasm  Rationale for Evaluation and Treatment Rehabilitation  PERTINENT HISTORY: Hemorrhoidectomy 05/26/21  PRECAUTIONS: NA  SUBJECTIVE: Pt states that exercises are tough, but she is  trying to do them some throughout the week. She did notice some improvement over the last week, but nothing big. However, she states that sensation of pressure is mostly gone. Her expectation was that after last treatment session she would consistently have one bowel movement a day; she states that sometimes this would happen in  the last week, but sometimes it was more like it's been.   PAIN:  Are you having pain? No   SUBJECTIVE 09/06/21:  SUBJECTIVE STATEMENT: Pt had hemorrhoidectomy on 05/26/21. She started having loose stools afterwards and she would end up voiding fecal matter every time she urinated. She states that this has improved some and does not happen every single time, but it does still happen. She does report some fecal urgency. She states that she has to go several times before she is completely empty. She states that bowel movements used to be every morning right after she drank her coffee and well formed. She now has soft bowel movements that are not well formed and comes in sections. She feels like if there is any amount of feces there, she feels pressure and urgency.    Fluid intake: Yes: states not enough water; caffeine in the morning - 2 cups       PAIN:  Are you having pain? No     PATIENT GOALS She would like to feel confident enough in bowels that she did not have to wear a pad; gain control over bowels    BOWEL MOVEMENT Pain with bowel movement: No Type of bowel movement:Frequency several times in the morning and Strain No Fully empty rectum: No Leakage: Yes: having a lot of gas and sometimes she will leak when relieving gas Pads: Yes: daily Fiber supplement: No - not doing this anymore or focusing on fiber rich foods due to large increase in uncomfortable gas        URINATION Pain with urination: No Fully empty bladder: Yes: - Stream:  WNL Urgency: No Frequency: more often in the morning with coffee, then 2-3 hours; no nocturia, occasionally 1x/night Leakage:  none Pads: Yes: daily   INTERCOURSE Pain with intercourse:  not currently sexually active, but became painful after menopause Ability to have vaginal penetration: unsure Climax: Does not perform     PREGNANCY Vaginal deliveries 1 Tearing Yes: - C-section deliveries 1 Currently pregnant No       OBJECTIVE 09/06/21:      COGNITION:            Overall cognitive status: Within functional limits for tasks assessed                          SENSATION:            Light touch: Appears intact            Proprioception: Appears intact     GAIT:   Comments: WNL                POSTURE: rounded shoulders, forward head, and posterior pelvic tilt      PALPATION:   General  Not assessed                 External Perineal Exam External hemorrhoids visible                             Internal Pelvic Floor Posterior scar tissue present inferior to coccyx; increased pelvic floor muscle tone/restriction with reproduction of heaviness sensation upon palpation; restriction/tone worse Lt>Rt   Patient confirms identification and approves PT to assess internal pelvic floor and treatment Yes   No emotional/communication barriers or cognitive limitation. Patient is motivated to learn. Patient understands and agrees with treatment goals and plan. PT explains patient will be examined in standing, sitting, and lying down to see how their muscles and joints work. When they are ready, they will be asked to remove  their underwear so PT can examine their perineum. The patient is also given the option of providing their own chaperone as one is not provided in our facility. The patient also has the right and is explained the right to defer or refuse any part of the evaluation or treatment including the  internal exam. With the patient's consent, PT will use one gloved finger to gently assess the muscles of the pelvic floor, seeing how well it contracts and relaxes and if there is muscle symmetry. After, the patient will get dressed and PT and patient will discuss exam findings and plan of care. PT and patient discuss plan of care, schedule, attendance policy and HEP activities.     PELVIC MMT: 2/5 strength 3 second endurance  4 repetitions            TONE: High   PROLAPSE: No rectal prolapse noted this session   TODAY'S TREATMENT 09/29/21 Manual: Myofascial release: Bowel massage Internal pelvic floor techniques: No emotional/communication barriers or cognitive limitation. Patient is motivated to learn. Patient understands and agrees with treatment goals and plan. PT explains patient will be examined in standing, sitting, and lying down to see how their muscles and joints work. When they are ready, they will be asked to remove their underwear so PT can examine their perineum. The patient is also given the option of providing their own chaperone as one is not provided in our facility. The patient also has the right and is explained the right to defer or refuse any part of the evaluation or treatment including the internal exam. With the patient's consent, PT will use one gloved finger to gently assess the muscles of the pelvic floor, seeing how well it contracts and relaxes and if there is muscle symmetry. After, the patient will get dressed and PT and patient will discuss exam findings and plan of care. PT and patient discuss plan of care, schedule, attendance policy and HEP activities. Rectal scar tissue release Rectal pelvic floor release Neuromuscular re-education: Pelvic floor contraction training: Focus on pelvic floor relaxation/bulge - pt having a very difficult time with release of pelvic floor; dominant chest breather which is not helping to relax pelvic floor; multimodal cues to  improve and patient encouraged to continue working on these techniques at home   TREATMENT 09/23/21 Manual: Myofascial release: Bowel massage Internal pelvic floor techniques: No emotional/communication barriers or cognitive limitation. Patient is motivated to learn. Patient understands and agrees with treatment goals and plan. PT explains patient will be examined in standing, sitting, and lying down to see how their muscles and joints work. When they are ready, they will be asked to remove their underwear so PT can examine their perineum. The patient is also given the option of providing their own chaperone as one is not provided in our facility. The patient also has the right and is explained the right to defer or refuse any part of the evaluation or treatment including the internal exam. With the patient's consent, PT will use one gloved finger to gently assess the muscles of the pelvic floor, seeing how well it contracts and relaxes and if there is muscle symmetry. After, the patient will get dressed and PT and patient will discuss exam findings and plan of care. PT and patient discuss plan of care, schedule, attendance policy and HEP activities. Rectal scar tissue release Rectal pelvic floor release Exercises: Stretches/mobility: Open books 10x bil Bent knee fall outs 5 x 10 sec bil Kneeling hip flexor stretch  60 sec bil  TREATMENT 09/06/21 EVAL  Neuromuscular re-education: Pelvic floor contraction training: Pelvic floor contraction training with internal rectal feedback Self-care: Squatty potty Relaxed toileting mechanics Importance of relaxation training Fiber intake       PATIENT EDUCATION:  Education details: Pelvic floor awareness/relaxation Person educated: Patient Education method: Explanation, Demonstration, Tactile cues, Verbal cues, and Handouts Education comprehension: verbalized understanding     HOME EXERCISE PROGRAM: [id number]   ASSESSMENT:   CLINICAL  IMPRESSION: Pt seeing some improvements in sensation of pelvic pressure and some days of having one good consistency bowel movement without straining. She continues to demonstrate pelvic floor tension that is most likely contributing to condition. She had good release with manual techniques, but when working on diaphragmatic breathing and pelvic floor bulge, she tends to contract paradoxically. We discussed how she may also be doing this during bowel movements. We practiced pelvic floor muscle bulge and breathing techniques to help improve better expulsion and improve awareness when she is having bowel movement at home. She will benefit from skilled PT intervention in order to reduce fecal urgency/incontinence, improve pelvic floor tone/scar tissue restriction, and improve QOL.      OBJECTIVE IMPAIRMENTS decreased activity tolerance, decreased coordination, decreased endurance, decreased mobility, decreased strength, hypomobility, increased fascial restrictions, increased muscle spasms, impaired tone, and pain.    ACTIVITY LIMITATIONS continence   PARTICIPATION LIMITATIONS: community activity   PERSONAL FACTORS 1 comorbidity: hemorrhoidectomy  are also affecting patient's functional outcome.    REHAB POTENTIAL: Good   CLINICAL DECISION MAKING: Stable/uncomplicated   EVALUATION COMPLEXITY: Low     GOALS: Goals reviewed with patient? Yes   SHORT TERM GOALS: Target date: 10/04/2021   Pt will be independent with HEP.    Baseline: Goal status: INITIAL   2.  Pt will be independent with use of squatty potty, relaxed toileting mechanics, and improved bowel movement techniques in order to increase ease of bowel movements and complete evacuation.    Baseline:  Goal status: INITIAL     LONG TERM GOALS: Target date: 11/01/2021    Pt will be independent with advanced HEP.    Baseline:  Goal status: INITIAL   2.  Pt will demonstrate normal pelvic floor muscle tone and A/ROM, able to achieve  4/5 strength with contractions and 10 sec endurance, in order to provide appropriate lumbopelvic support in functional activities.    Baseline:  Goal status: INITIAL   3.  Pt will be able to urinate without have bowel movement with loss of control.  Baseline:  Goal status: INITIAL   4.  Pt will report no episodes of fecal incontinence in order to improve confidence in community activities and personal hygiene.    Baseline:  Goal status: INITIAL   5.  Pt will demonstrate normal pelvic floor tone and rectal scar tissue mobility.  Baseline:  Goal status: INITIAL       PLAN: PT FREQUENCY: 1x/week   PT DURATION: 8 weeks   PLANNED INTERVENTIONS: Therapeutic exercises, Therapeutic activity, Neuromuscular re-education, Balance training, Gait training, Patient/Family education, Joint mobilization, Dry Needling, Biofeedback, and Manual therapy   PLAN FOR NEXT SESSION: progress abdominal/hip mobility exercises; begin gentle strengthening; manual techniques as needed.   Heather Roberts, PT, DPT07/19/2311:00 AM

## 2021-10-13 ENCOUNTER — Ambulatory Visit: Payer: Medicare Other

## 2021-10-20 ENCOUNTER — Encounter: Payer: Medicare Other | Admitting: Physical Therapy

## 2021-10-27 ENCOUNTER — Ambulatory Visit: Payer: Medicare Other | Attending: Surgery | Admitting: Physical Therapy

## 2021-10-27 DIAGNOSIS — R293 Abnormal posture: Secondary | ICD-10-CM | POA: Insufficient documentation

## 2021-10-27 DIAGNOSIS — M62838 Other muscle spasm: Secondary | ICD-10-CM | POA: Insufficient documentation

## 2021-10-27 DIAGNOSIS — R279 Unspecified lack of coordination: Secondary | ICD-10-CM | POA: Insufficient documentation

## 2021-10-27 NOTE — Therapy (Signed)
OUTPATIENT PHYSICAL THERAPY TREATMENT NOTE   Patient Name: Lauren Francis MRN: [id number] DOB:December 25, 1945, 76 y.o., female Today's Date: 10/27/2021  PCP: Harlan Stains, MD REFERRING PROVIDER: Michael Boston, MD  END OF SESSION:   PT End of Session - 10/27/21 0931     Visit Number 4    Date for PT Re-Evaluation 11/01/21    Authorization Type [employer] Medicare    Progress Note Due on Visit 10    PT Start Time 0932    PT Stop Time 1012    PT Time Calculation (min) 40 min    Activity Tolerance Patient tolerated treatment well    Behavior During Therapy WFL for tasks assessed/performed              Past Medical History:  Diagnosis Date   Back pain    Contact lens/glasses fitting    wears contacts or glasses   Fracture acetabulum-closed, left, sequela    Osteopenia    RBBB    Spondylolisthesis    Past Surgical History:  Procedure Laterality Date   BACK SURGERY  2012   lumb fusion   CARPAL TUNNEL RELEASE Right 01/04/2013   Procedure: CARPAL TUNNEL RELEASE;  Surgeon: Wynonia Sours, MD;  Location: [facility];  Service: Orthopedics;  Laterality: Right;   CESAREAN SECTION     COLONOSCOPY     HAND TENDON SURGERY Right    tendon release   PLANTAR FASCIA SURGERY  1998   lt   tendonitis  2006   lt hand   Patient Active Problem List   Diagnosis Date Noted   Hypercholesteremia 08/15/2011   Hypothyroid 08/15/2011   Osteopenia 08/15/2011   Internal hemorrhoids with thrombosis & prolapse 11/01/2010    REFERRING DIAG: Z86.010 (ICD-10-CM) - Personal history of colonic polyps K64.4 (ICD-10-CM) - Residual hemorrhoidal skin tags K64.3 (ICD-10-CM) - Fourth degree hemorrhoids  THERAPY DIAG:  Unspecified lack of coordination  Abnormal posture  Other muscle spasm  Rationale for Evaluation and Treatment Rehabilitation  PERTINENT HISTORY: Hemorrhoidectomy 05/26/21  PRECAUTIONS: NA  SUBJECTIVE: Pt reports she has not been able to do exercises a lot as she  has been have on vacation for two weeks between now and last visit. Now longer having to wear a pad and no leakage. Pt now only using restroom 1-2x in the morning.   PAIN:  Are you having pain? No   SUBJECTIVE 09/06/21:  SUBJECTIVE STATEMENT: Pt had hemorrhoidectomy on 05/26/21. She started having loose stools afterwards and she would end up voiding fecal matter every time she urinated. She states that this has improved some and does not happen every single time, but it does still happen. She does report some fecal urgency. She states that she has to go several times before she is completely empty. She states that bowel movements used to be every morning right after she drank her coffee and well formed. She now has soft bowel movements that are not well formed and comes in sections. She feels like if there is any amount of feces there, she feels pressure and urgency.    Fluid intake: Yes: states not enough water; caffeine in the morning - 2 cups       PAIN:  Are you having pain? No     PATIENT GOALS She would like to feel confident enough in bowels that she did not have to wear a pad; gain control over bowels    BOWEL MOVEMENT Pain with bowel movement: No Type of bowel movement:Frequency several times in the morning and Strain No Fully empty rectum: No Leakage: Yes: having a lot of gas and sometimes she will leak when relieving gas Pads: Yes: daily Fiber supplement: No - not doing this anymore or focusing on fiber rich foods due to large increase in uncomfortable gas       URINATION Pain with urination: No Fully empty bladder: Yes: - Stream:  WNL Urgency: No Frequency: more often in the morning with coffee, then 2-3 hours; no nocturia, occasionally 1x/night Leakage:  none Pads: Yes: daily    INTERCOURSE Pain with intercourse:  not currently sexually active, but became painful after menopause Ability to have vaginal penetration: unsure Climax: Does not perform     PREGNANCY Vaginal deliveries 1 Tearing Yes: - C-section deliveries 1 Currently pregnant No       OBJECTIVE 09/06/21:      COGNITION:            Overall cognitive status: Within functional limits for tasks assessed                          SENSATION:            Light touch: Appears intact            Proprioception: Appears intact     GAIT:   Comments: WNL                POSTURE: rounded shoulders, forward head, and posterior pelvic tilt      PALPATION:   General  Not assessed                 External Perineal Exam External hemorrhoids visible                             Internal Pelvic Floor Posterior scar tissue present inferior to coccyx; increased pelvic floor muscle tone/restriction with reproduction of heaviness sensation upon palpation; restriction/tone worse Lt>Rt   Patient confirms identification and approves PT to assess internal pelvic floor and treatment Yes   No emotional/communication barriers or cognitive limitation. Patient is motivated to learn. Patient understands and agrees with treatment goals and plan. PT explains patient will be examined in standing, sitting, and lying down to see how their muscles and joints work. When they are ready, they will be asked to remove  their underwear so PT can examine their perineum. The patient is also given the option of providing their own chaperone as one is not provided in our facility. The patient also has the right and is explained the right to defer or refuse any part of the evaluation or treatment including the internal exam. With the patient's consent, PT will use one gloved finger to gently assess the muscles of the pelvic floor, seeing how well it contracts and relaxes and if there is muscle symmetry. After, the patient will get dressed and  PT and patient will discuss exam findings and plan of care. PT and patient discuss plan of care, schedule, attendance policy and HEP activities.     PELVIC MMT: 2/5 strength 3 second endurance  4 repetitions    10/27/2021: 4/5 strength/ 10s isometric/ 6 reps          TONE: High   PROLAPSE: No rectal prolapse noted this session   TODAY'S TREATMENT :  10/27/2021: Self care: pt educated on abdominal massage with PT completing this during session for improved carry over for home, fiber types handout given as pt had questions about foods for improved stools and types of fiber, pt also encouraged to ask MD about suggestions of fiber/probiotics/gas-ex as PT cannot suggest medications. Pt agreed. Pt also educated on balloon breathing technique for improved bowel habits and to decrease need of pushing. Pt demonstrated good recall and technique for this.  NMRE:  No emotional/communication barriers or cognitive limitation. Patient is motivated to learn. Patient understands and agrees with treatment goals and plan. PT explains patient will be examined in standing, sitting, and lying down to see how their muscles and joints work. When they are ready, they will be asked to remove their underwear so PT can examine their perineum. The patient is also given the option of providing their own chaperone as one is not provided in our facility. The patient also has the right and is explained the right to defer or refuse any part of the evaluation or treatment including the internal exam. With the patient's consent, PT will use one gloved finger to gently assess the muscles of the pelvic floor, seeing how well it contracts and relaxes and if there is muscle symmetry. After, the patient will get dressed and PT and patient will discuss exam findings and plan of care. PT and patient discuss plan of care, schedule, attendance policy and HEP activities.  Pt consented to internal rectal treatment, pt found to have improved  tissue mobility at 3,6,9 on clock face and very minor tension at 12 on clock face at EAS, no pain but pt did report she could feel slight tension with gentle stretching here. Noticeable improvement with mobility here with gentle stretching and pt reported she felt this as well. Pt able to demonstrate improved strength/endurance/coordination (results above) and demonstrated good mobility with contract/relax/bulge with full mobility of pelvic floor. Pt also instructed to attempt balloon breathing technique and demonstrated good technique without straining or tightening.     PATIENT EDUCATION:  Education details: Pelvic floor awareness/relaxation Person educated: Patient Education method: Explanation, Demonstration, Tactile cues, Verbal cues, and Handouts Education comprehension: verbalized understanding     HOME EXERCISE PROGRAM: [id number]   ASSESSMENT:   CLINICAL IMPRESSION: Pt reports she has noticed improvement, no longer having urgency or pressure feeling at rectum and bowel movements are much more consistent to 1-2x at most in the morning now, and no longer having any leakage, and feels fully empty most  often after bowel movements now. Pt session focused on reviewing abdominal massage as pt wanted to make sure she was doing this correctly, and reassessing rectal tone and strength which have both improved since eval and pt has met all goals. Pt verbalized she feels like she is doing much better and comfortable with DC today at end of session. Pt denied additional questions and concerns, understands she will need another referral for future PT needs.      OBJECTIVE IMPAIRMENTS decreased activity tolerance, decreased coordination, decreased endurance, decreased mobility, decreased strength, hypomobility, increased fascial restrictions, increased muscle spasms, impaired tone, and pain.    ACTIVITY LIMITATIONS continence   PARTICIPATION LIMITATIONS: community activity   PERSONAL FACTORS 1  comorbidity: hemorrhoidectomy  are also affecting patient's functional outcome.    REHAB POTENTIAL: Good   CLINICAL DECISION MAKING: Stable/uncomplicated   EVALUATION COMPLEXITY: Low     GOALS: Goals reviewed with patient? Yes   SHORT TERM GOALS: Target date: 10/04/2021   Pt will be independent with HEP.    Baseline: Goal status: MET   2.  Pt will be independent with use of squatty potty, relaxed toileting mechanics, and improved bowel movement techniques in order to increase ease of bowel movements and complete evacuation.    Baseline:  Goal status: MET     LONG TERM GOALS: Target date: 11/01/2021    Pt will be independent with advanced HEP.    Baseline:  Goal status: MET   2.  Pt will demonstrate normal pelvic floor muscle tone and A/ROM, able to achieve 4/5 strength with contractions and 10 sec endurance, in order to provide appropriate lumbopelvic support in functional activities.    Baseline:  Goal status: MET   3.  Pt will be able to urinate without have bowel movement with loss of control.  Baseline:  Goal status: MET   4.  Pt will report no episodes of fecal incontinence in order to improve confidence in community activities and personal hygiene.    Baseline:  Goal status: MET   5.  Pt will demonstrate normal pelvic floor tone and rectal scar tissue mobility.  Baseline:  Goal status: MET       PLAN: PT FREQUENCY: 1x/week   PT DURATION: 8 weeks   PLANNED INTERVENTIONS: Therapeutic exercises, Therapeutic activity, Neuromuscular re-education, Balance training, Gait training, Patient/Family education, Joint mobilization, Dry Needling, Biofeedback, and Manual therapy   PLAN FOR NEXT SESSION:   Stacy Gardner, PT, DPT 10/27/2308:16 AM   PHYSICAL THERAPY DISCHARGE SUMMARY  Visits from Start of Care: 4  Current functional level related to goals / functional outcomes: All goals met    Remaining deficits: All goals met   Education / Equipment: HEP     Patient agrees to discharge. Patient goals were met. Patient is being discharged due to meeting the stated rehab goals.   Stacy Gardner, PT, DPT 08/17/237:58 AM   [facility] [address] [place], [place] [address] Phone # [phone] Fax [phone]

## 2021-10-27 NOTE — Patient Instructions (Signed)
**   BALLOON BREATHING: make an open fist and blow through it as if blowing up a balloon. Do this instead of straining to have or finish a bowel movement.     Types of Fiber  There are two main types of fiber:  insoluble and soluble.  Both of these types can prevent and relieve constipation and diarrhea, although some people find one or the other to be more easily digested.  This handout details information about both types of fiber. recommended 25-35 grams of fiber per day,  average 9-12 grams per meal   key is a balance between soluble and insoluble  Insoluble Fiber        Functions of Insoluble Fiber moves bulk through the intestines  controls and balances the pH (acidity) in the intestines   This type of fiber should be avoided or reduced if you have soft, frequent bowel movements or leakage      Benefits of Insoluble Fiber promotes regular bowel movement and prevents constipation  removes fecal waste through colon in less time  keeps an optimal pH in intestines to prevent microbes from producing cancer substances, therefore preventing colon cancer        Food Sources of Insoluble Fiber whole-wheat products  wheat bran "miller's bran" corn bran  flax seed or other seeds vegetables such as green beans, broccoli, cauliflower and potato skins  fruit skins and root vegetable skins  popcorn brown rice  Soluble Fiber( Types 5,6,7)       Functions of Soluble Fiber  holds water in the colon to bulk and soften the stool prolongs stomach emptying time so that sugar is released and absorbed more slowly  prevent leakage associated with soft, frequent bowel movements.        Benefits of Soluble Fiber lowers total cholesterol and LDL cholesterol (the bad cholesterol) therefore reducing the risk of heart disease  regulates blood sugar for people with diabetes       Food Sources of Soluble Fiber oat/oat bran dried beans and peas  nuts  barley  flax seed or other seeds fruits such  as oranges, pears, peaches, and apples  vegetables such as carrots  psyllium husk  Prunes

## 2021-11-03 ENCOUNTER — Ambulatory Visit: Payer: Medicare Other | Admitting: Physical Therapy

## 2021-11-10 ENCOUNTER — Encounter: Payer: Medicare Other | Admitting: Physical Therapy

## 2021-11-24 DIAGNOSIS — Z780 Asymptomatic menopausal state: Secondary | ICD-10-CM | POA: Diagnosis not present

## 2021-11-24 DIAGNOSIS — M85852 Other specified disorders of bone density and structure, left thigh: Secondary | ICD-10-CM | POA: Diagnosis not present

## 2021-11-24 DIAGNOSIS — Z1231 Encounter for screening mammogram for malignant neoplasm of breast: Secondary | ICD-10-CM | POA: Diagnosis not present

## 2021-11-24 DIAGNOSIS — M85851 Other specified disorders of bone density and structure, right thigh: Secondary | ICD-10-CM | POA: Diagnosis not present

## 2021-11-24 DIAGNOSIS — M810 Age-related osteoporosis without current pathological fracture: Secondary | ICD-10-CM | POA: Diagnosis not present

## 2021-12-15 DIAGNOSIS — M810 Age-related osteoporosis without current pathological fracture: Secondary | ICD-10-CM | POA: Diagnosis not present

## 2021-12-15 DIAGNOSIS — R030 Elevated blood-pressure reading, without diagnosis of hypertension: Secondary | ICD-10-CM | POA: Diagnosis not present

## 2022-02-14 ENCOUNTER — Ambulatory Visit: Payer: Medicare Other | Attending: Family Medicine | Admitting: Occupational Therapy

## 2022-02-14 DIAGNOSIS — M6281 Muscle weakness (generalized): Secondary | ICD-10-CM | POA: Insufficient documentation

## 2022-02-14 DIAGNOSIS — M25542 Pain in joints of left hand: Secondary | ICD-10-CM | POA: Insufficient documentation

## 2022-02-24 NOTE — Therapy (Signed)
[facility] [address], [place], [address] Phone: [phone]   Fax:  [phone]  Occupational Therapy Evaluation  Patient Details  Name: Lauren Francis MRN: [id number] Date of Birth: Sep 21, 1945 Referring Provider (OT): Harlan Stains   Encounter Date: 02/14/2022   OT End of Session - 02/24/22 0959     Visit Number 1    Number of Visits 3    Date for OT Re-Evaluation 03/21/22    OT Start Time 1120    OT Stop Time 6568    OT Time Calculation (min) 64 min    Activity Tolerance Patient tolerated treatment well    Behavior During Therapy [facility] for tasks assessed/performed             Past Medical History:  Diagnosis Date   Back pain    Contact lens/glasses fitting    wears contacts or glasses   Fracture acetabulum-closed, left, sequela    Osteopenia    RBBB    Spondylolisthesis     Past Surgical History:  Procedure Laterality Date   BACK SURGERY  2012   lumb fusion   CARPAL TUNNEL RELEASE Right 01/04/2013   Procedure: CARPAL TUNNEL RELEASE;  Surgeon: Wynonia Sours, MD;  Location: [place];  Service: Orthopedics;  Laterality: Right;   CESAREAN SECTION     COLONOSCOPY     HAND TENDON SURGERY Right    tendon release   PLANTAR FASCIA SURGERY  1998   lt   tendonitis  2006   lt hand    There were no vitals filed for this visit.   Subjective Assessment - 02/24/22 1002     Subjective  Pt reports she had a bone density scan, dropped below osteoporosis levels in bilateral wrists , she reports her doctor wanted her to have therapy to help strengthen wrists.  Pt denies any pain at evaluation.  She reports she does have pain occasionally at the base of the left thumb CMC joint about once a week or two brief pain when moving it a certain way.    Pertinent History Pt reports recent cone density scan with results of numbers dropping below at the wrists, indicating osteoporosis.   Chart indicates history of tendonitis in right hand and carpal tunnel release right, 2014.    Patient Stated Goals Pt reports she wants some exercises to do to help strengthen her wrists and maintain her independence.    Currently in Pain? No/denies               [facility] OT Assessment - 02/24/22 1014       Assessment   Medical Diagnosis osteoporosis bilateral wrists    Referring Provider (OT) Dema Severin, Cynthia    Hand Dominance Right      Balance Screen   Has the patient fallen in the past 6 months No      Home  Environment   Family/patient expects to be discharged to: Private residence    Type of [place]    Lives With Family      Prior Function   Level of Independence Independent    Vocation Retired    Leisure walking, activities in her senior community      ADL   ADL comments Able to complete basic self care tasks, mild difficulty with opening new or tight jars and gardening.      Written Expression   Dominant Hand Right  Observation/Other Assessments   Focus on Therapeutic Outcomes (FOTO)  79      Sensation   Light Touch Appears Intact    Hot/Cold Appears Intact      AROM   Overall AROM Comments Right shoulder flexion 140, left 130 degrees,, ABD WNLs, supination/pronation WNLs, right wrist flexion  55 degrees, extension 74.  Left wrist flexion 60 degrees, extension 75. Full fisting bilaterally, full opposition bilaterally      Strength   Overall Strength Comments bilateral shoulder flexion 3/5, elbow 4/5, wrists 4/5.      Hand Function   Right Hand Grip (lbs) 56    Right Hand Lateral Pinch 8 lbs    Right Hand 3 Point Pinch 7 lbs    Left Hand Grip (lbs) 54    Left Hand Lateral Pinch 8 lbs    Left 3 point pinch 8 lbs            Pt instructed on tendon gliding exercises, wrist exercises for flexion/extension, ulnar and radial deviation as well as forearm supination/pronation for ROM.   Weight bearing exercise with wall push ups.  Will advance exercises  next session.  Pt instructed on joint protection principles with daily tasks, specifically with ADL and IADLs and in the kitchen.  She would benefit from further instruction on tools to help with kitchen tasks, opening jars and gardening.  Handouts provided for her to read and review prior to next session.                  OT Education - 02/24/22 1013     Education Details HEP for wrist ROM, strengthening and weight bearing exercises, joint protection principles    Person(s) Educated Patient    Methods Explanation;Handout;Demonstration    Comprehension Verbalized understanding;Returned demonstration              OT Short Term Goals - 02/24/22 1006       OT SHORT TERM GOAL #1   Title Pt will demonstrate understanding of home exercise program for ROM and strengthening of bilateral wrists.    Baseline no current program    Time 2    Period Weeks    Status New    Target Date 02/28/22               OT Long Term Goals - 02/24/22 1007       OT LONG TERM GOAL #1   Title Pt will demonstrate understanding of joint protection principles for bilateral UEs when engaging in ADL and IADL tasks.    Baseline Eval: no current knowledge.    Time 4    Period Weeks    Status New    Target Date 03/21/22      OT LONG TERM GOAL #2   Title Pt will demonstrate ability to open a jar without dificulty, utilizing adaptive equipment and joint protection principles.    Baseline eval:  mild difficulty with opening jars.    Time 4    Period Weeks    Status New    Target Date 03/21/22      OT LONG TERM GOAL #3   Title Pt will complete gardening tasks with modified independence using adapted tools and joint protection principles.    Baseline Eval:  mild difficulty with gardening tasks.    Time 4    Period Weeks    Status New    Target Date 03/21/22      OT LONG TERM GOAL #4  Title Pt will improve strength in bilateral wrists by 1 mm grade to assist with IADL tasks with  ease.    Baseline 4/5 strength in wrists at eval                   Plan - 02/24/22 1011     OT Occupational Profile and History Problem Focused Assessment - Including review of records relating to presenting problem    Occupational performance deficits (Please refer to evaluation for details): ADL's;IADL's    Body Structure / Function / Physical Skills ADL;UE functional use;Decreased knowledge of use of DME;Pain;Strength;IADL    Psychosocial Skills Environmental  Adaptations;Routines and Behaviors;Habits    Rehab Potential Excellent    Clinical Decision Making Several treatment options, min-mod task modification necessary    Comorbidities Affecting Occupational Performance: May have comorbidities impacting occupational performance    Modification or Assistance to Complete Evaluation  No modification of tasks or assist necessary to complete eval    OT Frequency Other (comment)   3 visits   OT Duration 4 weeks    OT Treatment/Interventions Self-care/ADL training;Fluidtherapy;Therapeutic exercise;Energy conservation;Therapeutic activities;Patient/family education;Splinting;Manual Therapy;DME and/or AE instruction;Neuromuscular education;Contrast Bath;Moist Heat;Paraffin;Cryotherapy    Consulted and Agree with Plan of Care Patient             Patient will benefit from skilled therapeutic intervention in order to improve the following deficits and impairments:   Body Structure / Function / Physical Skills: ADL, UE functional use, Decreased knowledge of use of DME, Pain, Strength, IADL   Psychosocial Skills: Environmental  Adaptations, Routines and Behaviors, Habits   Visit Diagnosis: Muscle weakness (generalized)  Pain in thumb joint with movement of left hand    Problem List Patient Active Problem List   Diagnosis Date Noted   Hypercholesteremia 08/15/2011   Hypothyroid 08/15/2011   Osteopenia 08/15/2011   Internal hemorrhoids with thrombosis & prolapse 11/01/2010    Zhavia Cunanan T Alanya Vukelich, OTR/L, CLT  Brenda Samano, OT 02/24/2022, 10:30 AM  [place] [facility] [address], [place], [address] Phone: [phone]   Fax:  [phone]  Name: Lauren Francis MRN: [id number] Date of Birth: Mar 12, 1946

## 2022-02-28 ENCOUNTER — Ambulatory Visit: Payer: Medicare Other | Admitting: Occupational Therapy

## 2022-02-28 DIAGNOSIS — M25542 Pain in joints of left hand: Secondary | ICD-10-CM

## 2022-02-28 DIAGNOSIS — M6281 Muscle weakness (generalized): Secondary | ICD-10-CM

## 2022-02-28 NOTE — Therapy (Signed)
[place] [address] [place], [address] Phone: [phone]   Fax:  [phone]  Occupational Therapy Treatment  Patient Details  Name: Lauren Francis MRN: [id number] Date of Birth: 06/25/1945 Referring Provider (OT): Harlan Stains   Encounter Date: 02/28/2022   OT End of Session - 02/28/22 1049     Visit Number 2    Number of Visits 3    Date for OT Re-Evaluation 03/21/22    OT Start Time 0911    OT Stop Time 0950    OT Time Calculation (min) 39 min    Activity Tolerance Patient tolerated treatment well    Behavior During Therapy [facility] for tasks assessed/performed             Past Medical History:  Diagnosis Date   Back pain    Contact lens/glasses fitting    wears contacts or glasses   Fracture acetabulum-closed, left, sequela    Osteopenia    RBBB    Spondylolisthesis     Past Surgical History:  Procedure Laterality Date   BACK SURGERY  2012   lumb fusion   CARPAL TUNNEL RELEASE Right 01/04/2013   Procedure: CARPAL TUNNEL RELEASE;  Surgeon: Wynonia Sours, MD;  Location: [place];  Service: Orthopedics;  Laterality: Right;   CESAREAN SECTION     COLONOSCOPY     HAND TENDON SURGERY Right    tendon release   PLANTAR FASCIA SURGERY  1998   lt   tendonitis  2006   lt hand    There were no vitals filed for this visit.   Subjective Assessment - 02/28/22 1047     Subjective  Have been doing the exercises that she told me.  I do of some pain at my left thumb at times.  If I can get things that I can just do at home.    Pertinent History Pt reports recent cone density scan with results of numbers dropping below at the wrists, indicating osteoporosis.  Chart indicates history of tendonitis in right hand and carpal tunnel release right, 2014.    Patient Stated Goals Pt reports she wants some exercises to do to help strengthen her wrists and maintain her  independence.    Currently in Pain? No/denies                [facility] OT Assessment - 02/28/22 0001       Strength   Overall Strength Comments wrist in all planes 5/5      Left Hand AROM   L Thumb Opposition to Index --   opposition to 4th ad 5th some pain at thumb CMC     Hand Function   Right Hand Grip (lbs) 56    Right Hand Lateral Pinch 9 lbs    Right Hand 3 Point Pinch 8 lbs    Left Hand Grip (lbs) 54    Left Hand Lateral Pinch 9 lbs    Left 3 point pinch 9 lbs                  Patient arrive with questions about home exercises.  Recommended for patient to continue with her water aerobics for weightbearing and resistance of water in season. Also recommended tai chi [facility] at [facility] is great for balance and weightbearing and decreasing falls. Recommended for patient to do more weightbearing exercises through the elbow and not through the palms because of some  discomfort at left [facility]. Active range of motion within normal limits wrist and digits except some discomfort at left thumb CMC during opposition to fourth and fifth. Strength in the wrist in all planes 5/5. Reviewed with patient 2 pound weights for wrist exercises -reinforced with patient not to over grip and in a pain-free range. 3 times a week 10 reps.  Wrist and forearm in all planes reviewed.  Pain-free. Patient can do moist heat in the morning to decrease stiffness and pain prior to thumb palmar radial abduction as well as opposition to all digits 10 reps pain-free.  Also reviewed with patient joint protection and modifications about using larger joints as well as has built-up handles her grips avoid static tight pinch and grip. Reviewed some adaptive equipment with patient patient verbalized understanding feels like she can continue at home.           OT Education - 02/28/22 1048     Education Details HEP for wrist  strengthening , thumb AROM  -joint protection and modifications     Person(s) Educated Patient    Methods Explanation;Handout;Demonstration    Comprehension Verbalized understanding;Returned demonstration              OT Short Term Goals - 02/28/22 1053       OT SHORT TERM GOAL #1   Title Pt will demonstrate understanding of home exercise program for ROM and strengthening of bilateral wrists.    Status Achieved               OT Long Term Goals - 02/28/22 1053       OT LONG TERM GOAL #1   Title Pt will demonstrate understanding of joint protection principles for bilateral UEs when engaging in ADL and IADL tasks.    Status Achieved      OT LONG TERM GOAL #2   Title Pt will demonstrate ability to open a jar without dificulty, utilizing adaptive equipment and joint protection principles.    Baseline info provided for AE    Status Achieved      OT LONG TERM GOAL #3   Title Pt will complete gardening tasks with modified independence using adapted tools and joint protection principles.    Baseline Eval:  mild difficulty with gardening tasks.    Time 4    Period Weeks    Status On-going    Target Date 03/21/22      OT LONG TERM GOAL #4   Title Pt will improve strength in bilateral wrists by 1 mm grade to assist with IADL tasks with ease.    Baseline WNL ROM and strenth 5/5    Status Achieved                   Plan - 02/28/22 1049     Clinical Impression Statement Pt is a 76 yo female, referred to OT after diagnosis of osteoporosis of bilateral wrists.  Pt reports occasional pain at the base of the left thumb at the [facility] joint which can occur when not moving or engaging the hand in task.  Pt AROM in wrist and digits WNL - grip WNL top precentile for her age -  prehension strength decrease with some pain in L thumb CMC. Pt ed on HEP for maintaining her ROM and strength in bilateral hands. AROM for digits and pain free use of 2 lbs weight for wrist. Review joint protection and modifications to task, as well as cont doing water  aerobics in  season and recommend Tai Chi at [facility]. Pt in agreement and can follow up with me as needed.    OT Occupational Profile and History Problem Focused Assessment - Including review of records relating to presenting problem    Occupational performance deficits (Please refer to evaluation for details): ADL's;IADL's    Body Structure / Function / Physical Skills ADL;UE functional use;Decreased knowledge of use of DME;Pain;Strength;IADL    Psychosocial Skills Environmental  Adaptations;Routines and Behaviors;Habits    Rehab Potential Excellent    Clinical Decision Making Several treatment options, min-mod task modification necessary    Comorbidities Affecting Occupational Performance: May have comorbidities impacting occupational performance    Modification or Assistance to Complete Evaluation  No modification of tasks or assist necessary to complete eval    OT Frequency Monthly    OT Duration 4 weeks    OT Treatment/Interventions Self-care/ADL training;Fluidtherapy;Therapeutic exercise;Energy conservation;Therapeutic activities;Patient/family education;Splinting;Manual Therapy;DME and/or AE instruction;Neuromuscular education;Contrast Bath;Moist Heat;Paraffin;Cryotherapy    Consulted and Agree with Plan of Care Patient             Patient will benefit from skilled therapeutic intervention in order to improve the following deficits and impairments:   Body Structure / Function / Physical Skills: ADL, UE functional use, Decreased knowledge of use of DME, Pain, Strength, IADL   Psychosocial Skills: Environmental  Adaptations, Routines and Behaviors, Habits   Visit Diagnosis: Muscle weakness (generalized)  Pain in thumb joint with movement of left hand    Problem List Patient Active Problem List   Diagnosis Date Noted   Hypercholesteremia 08/15/2011   Hypothyroid 08/15/2011   Osteopenia 08/15/2011   Internal hemorrhoids with thrombosis & prolapse 11/01/2010     Rosalyn Gess, OTR/L,CLT 02/28/2022, 10:56 AM  [place] [address], [place], [address] Phone: [phone]   Fax:  [phone]  Name: Lauren Francis MRN: [id number] Date of Birth: 1945-08-03

## 2022-04-18 DIAGNOSIS — H43813 Vitreous degeneration, bilateral: Secondary | ICD-10-CM | POA: Diagnosis not present

## 2022-05-19 ENCOUNTER — Ambulatory Visit
Admission: RE | Admit: 2022-05-19 | Discharge: 2022-05-19 | Disposition: A | Discharge status: Home / Self Care | Payer: Medicare HMO | Source: Ambulatory Visit | Attending: Family Medicine | Admitting: Family Medicine

## 2022-05-19 VITALS — BP 182/90 | HR 83 | Temp 97.6°F | Resp 16

## 2022-05-19 DIAGNOSIS — L239 Allergic contact dermatitis, unspecified cause: Secondary | ICD-10-CM

## 2022-05-19 DIAGNOSIS — R030 Elevated blood-pressure reading, without diagnosis of hypertension: Secondary | ICD-10-CM

## 2022-05-19 NOTE — ED Triage Notes (Signed)
Patient presents to [facility] for bilateral eye redness since Saturday. Taking Claritin. Patient states last year she had same issue related to allergies. States she spoke with nurse line and instructed to try aquafor. Today woke up with increased swelling and redness.

## 2022-05-19 NOTE — ED Provider Notes (Addendum)
Roderic Palau    CSN: [id number] Arrival date & time: 05/19/22  1138      History   Chief Complaint Chief Complaint  Patient presents with   Allergic Reaction    Make sure you take [facility] - Entered by patient    HPI Lauren Francis is a 77 y.o. female.    Allergic Reaction   Patient presents to urgent care with complaint of bilateral eye redness x 5 days.  She is using Claritin 10 mg once daily.  She endorses similar issue last year. Called health insurance nurse line and told to apply aquaphor to her eyelids and face  Past Medical History:  Diagnosis Date   Back pain    Contact lens/glasses fitting    wears contacts or glasses   Fracture acetabulum-closed, left, sequela    Osteopenia    RBBB    Spondylolisthesis     Patient Active Problem List   Diagnosis Date Noted   Hypercholesteremia 08/15/2011   Hypothyroid 08/15/2011   Osteopenia 08/15/2011   Internal hemorrhoids with thrombosis & prolapse 11/01/2010    Past Surgical History:  Procedure Laterality Date   BACK SURGERY  2012   lumb fusion   CARPAL TUNNEL RELEASE Right 01/04/2013   Procedure: CARPAL TUNNEL RELEASE;  Surgeon: Wynonia Sours, MD;  Location: [place];  Service: Orthopedics;  Laterality: Right;   CESAREAN SECTION     COLONOSCOPY     HAND TENDON SURGERY Right    tendon release   PLANTAR FASCIA SURGERY  1998   lt   tendonitis  2006   lt hand    OB History     Gravida  2   Para  2   Term      Preterm      AB      Living  2      SAB      IAB      Ectopic      Multiple      Live Births               Home Medications    Prior to Admission medications   Medication Sig Start Date End Date Taking? Authorizing Provider  ALPRAZolam (XANAX) 0.25 MG tablet Take 1 tablet (0.25 mg total) by mouth as needed. Patient not taking: Reported on 09/06/2021 05/17/17   Huel Cote, NP  aspirin 81 MG chewable tablet Chew 81 mg by  mouth daily.   Patient not taking: Reported on 09/06/2021    [provider]  ATORVASTATIN CALCIUM PO Take 20 mg by mouth every evening.     [provider]  Calcium Carbonate (CALCIUM 600 PO) Take by mouth 2 (two) times daily.   Patient not taking: Reported on 09/06/2021    [provider]  Cholecalciferol (VITAMIN D) 2000 UNITS tablet Take 2,000 Units by mouth daily.      [provider]  levothyroxine (SYNTHROID, LEVOTHROID) 100 MCG tablet Take 100 mcg by mouth daily.     [provider]  Multiple Vitamins-Minerals (CENTRUM PO) Take by mouth.   Patient not taking: Reported on 09/06/2021    [provider]    Family History Family History  Problem Relation Age of Onset   Hypertension Mother    Stroke Sister     Social History Social History   Tobacco Use   Smoking status: Never   Smokeless tobacco: Never  Substance Use Topics  Alcohol use: Yes    Comment: wine once a week   Drug use: No     Allergies   Codeine and Sulfa antibiotics   Review of Systems Review of Systems   Physical Exam Triage Vital Signs ED Triage Vitals  Enc Vitals Group     BP --      Pulse Rate 05/19/22 1240 83     Resp 05/19/22 1240 16     Temp 05/19/22 1240 97.6 F (36.4 C)     Temp Source 05/19/22 1240 Temporal     SpO2 05/19/22 1240 96 %     Weight --      Height --      Head Circumference --      Peak Flow --      Pain Score 05/19/22 1235 0     Pain Loc --      Pain Edu? --      Excl. in [place]? --    No data found.  Updated Vital Signs Pulse 83   Temp 97.6 F (36.4 C) (Temporal)   Resp 16   SpO2 96%   Visual Acuity Right Eye Distance:   Left Eye Distance:   Bilateral Distance:    Right Eye Near:   Left Eye Near:    Bilateral Near:     Physical Exam Vitals reviewed.  Constitutional:      Appearance: Normal appearance.  HENT:     Head:   Eyes:     Conjunctiva/sclera:     Right eye: Right conjunctiva is not  injected.     Left eye: Left conjunctiva is not injected.   Skin:    General: Skin is warm and dry.  Neurological:     General: No focal deficit present.     Mental Status: She is alert and oriented to person, place, and time.  Psychiatric:        Mood and Affect: Mood normal.        Behavior: Behavior normal.      UC Treatments / Results  Labs (all labs ordered are listed, but only abnormal results are displayed) Labs Reviewed - No data to display  EKG   Radiology No results found.  Procedures Procedures (including critical care time)  Medications Ordered in UC Medications - No data to display  Initial Impression / Assessment and Plan / UC Course  I have reviewed the triage vital signs and the nursing notes.  Pertinent labs & imaging results that were available during my care of the patient were reviewed by me and considered in my medical decision making (see chart for details).   Presumed allergic dermatitis from unknown allergen, possibly secondary to seasonal allergies given her reported history.  Suggested patient increase Claritin to 10 mg twice a day.  Did not recommend continued use of Aquaphor on her eyelids but suggested evaluation by an eye doctor.  Asked her to follow-up with her primary care provider regarding her elevated blood pressure measurements today.  Final Clinical Impressions(s) / UC Diagnoses   Final diagnoses:  None   Discharge Instructions   None    ED Prescriptions   None    PDMP not reviewed this encounter.   Rose Phi, FNP 05/19/22 1305    Rose Phi, [place] 05/19/22 1306

## 2022-05-19 NOTE — Discharge Instructions (Addendum)
Increase antihistamine use to twice a day.  May use Benadryl at nighttime.  Recommend follow-up with an eye doctor who may be able to provide an alternative treatment.  Follow up with your PCP regarding elevated BP  measurements today.

## 2022-05-20 DIAGNOSIS — L239 Allergic contact dermatitis, unspecified cause: Secondary | ICD-10-CM | POA: Diagnosis not present

## 2022-05-20 DIAGNOSIS — I10 Essential (primary) hypertension: Secondary | ICD-10-CM | POA: Diagnosis not present

## 2022-05-20 DIAGNOSIS — R69 Illness, unspecified: Secondary | ICD-10-CM | POA: Diagnosis not present

## 2022-07-04 DIAGNOSIS — E039 Hypothyroidism, unspecified: Secondary | ICD-10-CM | POA: Diagnosis not present

## 2022-07-04 DIAGNOSIS — F419 Anxiety disorder, unspecified: Secondary | ICD-10-CM | POA: Diagnosis not present

## 2022-07-04 DIAGNOSIS — H5789 Other specified disorders of eye and adnexa: Secondary | ICD-10-CM | POA: Diagnosis not present

## 2022-07-04 DIAGNOSIS — I10 Essential (primary) hypertension: Secondary | ICD-10-CM | POA: Diagnosis not present

## 2022-07-04 DIAGNOSIS — E785 Hyperlipidemia, unspecified: Secondary | ICD-10-CM | POA: Diagnosis not present

## 2022-07-05 DIAGNOSIS — D225 Melanocytic nevi of trunk: Secondary | ICD-10-CM | POA: Diagnosis not present

## 2022-07-05 DIAGNOSIS — Z1283 Encounter for screening for malignant neoplasm of skin: Secondary | ICD-10-CM | POA: Diagnosis not present

## 2022-07-05 DIAGNOSIS — L308 Other specified dermatitis: Secondary | ICD-10-CM | POA: Diagnosis not present

## 2022-07-15 DIAGNOSIS — I951 Orthostatic hypotension: Secondary | ICD-10-CM | POA: Diagnosis not present

## 2022-07-15 DIAGNOSIS — E785 Hyperlipidemia, unspecified: Secondary | ICD-10-CM | POA: Diagnosis not present

## 2022-07-15 DIAGNOSIS — F419 Anxiety disorder, unspecified: Secondary | ICD-10-CM | POA: Diagnosis not present

## 2022-07-15 DIAGNOSIS — L309 Dermatitis, unspecified: Secondary | ICD-10-CM | POA: Diagnosis not present

## 2022-07-15 DIAGNOSIS — Z008 Encounter for other general examination: Secondary | ICD-10-CM | POA: Diagnosis not present

## 2022-07-15 DIAGNOSIS — E039 Hypothyroidism, unspecified: Secondary | ICD-10-CM | POA: Diagnosis not present

## 2022-07-15 DIAGNOSIS — I10 Essential (primary) hypertension: Secondary | ICD-10-CM | POA: Diagnosis not present

## 2022-09-04 DIAGNOSIS — U071 COVID-19: Secondary | ICD-10-CM | POA: Diagnosis not present

## 2022-10-12 DIAGNOSIS — S42022A Displaced fracture of shaft of left clavicle, initial encounter for closed fracture: Secondary | ICD-10-CM | POA: Diagnosis not present

## 2022-10-12 DIAGNOSIS — M25512 Pain in left shoulder: Secondary | ICD-10-CM | POA: Diagnosis not present

## 2022-10-12 DIAGNOSIS — W109XXA Fall (on) (from) unspecified stairs and steps, initial encounter: Secondary | ICD-10-CM | POA: Diagnosis not present

## 2022-10-12 DIAGNOSIS — S01551A Open bite of lip, initial encounter: Secondary | ICD-10-CM | POA: Diagnosis not present

## 2022-10-12 DIAGNOSIS — S42002A Fracture of unspecified part of left clavicle, initial encounter for closed fracture: Secondary | ICD-10-CM | POA: Diagnosis not present

## 2022-10-12 DIAGNOSIS — Y92099 Unspecified place in other non-institutional residence as the place of occurrence of the external cause: Secondary | ICD-10-CM | POA: Diagnosis not present

## 2022-10-14 DIAGNOSIS — S42002A Fracture of unspecified part of left clavicle, initial encounter for closed fracture: Secondary | ICD-10-CM | POA: Diagnosis not present

## 2022-10-20 DIAGNOSIS — S42002A Fracture of unspecified part of left clavicle, initial encounter for closed fracture: Secondary | ICD-10-CM | POA: Diagnosis not present

## 2022-10-20 DIAGNOSIS — Y999 Unspecified external cause status: Secondary | ICD-10-CM | POA: Diagnosis not present

## 2022-10-20 DIAGNOSIS — S42021A Displaced fracture of shaft of right clavicle, initial encounter for closed fracture: Secondary | ICD-10-CM | POA: Diagnosis not present

## 2022-10-20 DIAGNOSIS — X58XXXA Exposure to other specified factors, initial encounter: Secondary | ICD-10-CM | POA: Diagnosis not present

## 2022-11-02 DIAGNOSIS — S42002D Fracture of unspecified part of left clavicle, subsequent encounter for fracture with routine healing: Secondary | ICD-10-CM | POA: Diagnosis not present

## 2022-11-18 DIAGNOSIS — S42002D Fracture of unspecified part of left clavicle, subsequent encounter for fracture with routine healing: Secondary | ICD-10-CM | POA: Diagnosis not present

## 2022-12-07 DIAGNOSIS — Z1231 Encounter for screening mammogram for malignant neoplasm of breast: Secondary | ICD-10-CM | POA: Diagnosis not present

## 2023-01-04 DIAGNOSIS — Z860100 Personal history of colon polyps, unspecified: Secondary | ICD-10-CM | POA: Diagnosis not present

## 2023-01-04 DIAGNOSIS — F419 Anxiety disorder, unspecified: Secondary | ICD-10-CM | POA: Diagnosis not present

## 2023-01-04 DIAGNOSIS — I7090 Unspecified atherosclerosis: Secondary | ICD-10-CM | POA: Diagnosis not present

## 2023-01-04 DIAGNOSIS — E039 Hypothyroidism, unspecified: Secondary | ICD-10-CM | POA: Diagnosis not present

## 2023-01-04 DIAGNOSIS — M810 Age-related osteoporosis without current pathological fracture: Secondary | ICD-10-CM | POA: Diagnosis not present

## 2023-01-04 DIAGNOSIS — E785 Hyperlipidemia, unspecified: Secondary | ICD-10-CM | POA: Diagnosis not present

## 2023-01-04 DIAGNOSIS — L900 Lichen sclerosus et atrophicus: Secondary | ICD-10-CM | POA: Diagnosis not present

## 2023-01-04 DIAGNOSIS — Z0000 Encounter for general adult medical examination without abnormal findings: Secondary | ICD-10-CM | POA: Diagnosis not present

## 2023-01-04 DIAGNOSIS — I10 Essential (primary) hypertension: Secondary | ICD-10-CM | POA: Diagnosis not present

## 2023-01-04 DIAGNOSIS — Z9181 History of falling: Secondary | ICD-10-CM | POA: Diagnosis not present

## 2023-01-04 DIAGNOSIS — E559 Vitamin D deficiency, unspecified: Secondary | ICD-10-CM | POA: Diagnosis not present

## 2023-01-06 DIAGNOSIS — S42002D Fracture of unspecified part of left clavicle, subsequent encounter for fracture with routine healing: Secondary | ICD-10-CM | POA: Diagnosis not present

## 2023-01-09 DIAGNOSIS — S42002D Fracture of unspecified part of left clavicle, subsequent encounter for fracture with routine healing: Secondary | ICD-10-CM | POA: Diagnosis not present

## 2023-01-11 DIAGNOSIS — S42002D Fracture of unspecified part of left clavicle, subsequent encounter for fracture with routine healing: Secondary | ICD-10-CM | POA: Diagnosis not present

## 2023-01-17 DIAGNOSIS — S42002D Fracture of unspecified part of left clavicle, subsequent encounter for fracture with routine healing: Secondary | ICD-10-CM | POA: Diagnosis not present

## 2023-01-20 DIAGNOSIS — S42002D Fracture of unspecified part of left clavicle, subsequent encounter for fracture with routine healing: Secondary | ICD-10-CM | POA: Diagnosis not present

## 2023-01-26 DIAGNOSIS — S42002D Fracture of unspecified part of left clavicle, subsequent encounter for fracture with routine healing: Secondary | ICD-10-CM | POA: Diagnosis not present

## 2023-01-30 DIAGNOSIS — I10 Essential (primary) hypertension: Secondary | ICD-10-CM | POA: Diagnosis not present

## 2023-01-30 DIAGNOSIS — E559 Vitamin D deficiency, unspecified: Secondary | ICD-10-CM | POA: Diagnosis not present

## 2023-01-30 DIAGNOSIS — E785 Hyperlipidemia, unspecified: Secondary | ICD-10-CM | POA: Diagnosis not present

## 2023-02-20 DIAGNOSIS — S42002D Fracture of unspecified part of left clavicle, subsequent encounter for fracture with routine healing: Secondary | ICD-10-CM | POA: Diagnosis not present

## 2023-03-01 DIAGNOSIS — Z09 Encounter for follow-up examination after completed treatment for conditions other than malignant neoplasm: Secondary | ICD-10-CM | POA: Diagnosis not present

## 2023-03-01 DIAGNOSIS — K644 Residual hemorrhoidal skin tags: Secondary | ICD-10-CM | POA: Diagnosis not present

## 2023-03-01 DIAGNOSIS — D120 Benign neoplasm of cecum: Secondary | ICD-10-CM | POA: Diagnosis not present

## 2023-03-01 DIAGNOSIS — D125 Benign neoplasm of sigmoid colon: Secondary | ICD-10-CM | POA: Diagnosis not present

## 2023-03-01 DIAGNOSIS — K648 Other hemorrhoids: Secondary | ICD-10-CM | POA: Diagnosis not present

## 2023-03-01 DIAGNOSIS — K6289 Other specified diseases of anus and rectum: Secondary | ICD-10-CM | POA: Diagnosis not present

## 2023-03-01 DIAGNOSIS — Z860101 Personal history of adenomatous and serrated colon polyps: Secondary | ICD-10-CM | POA: Diagnosis not present

## 2023-03-01 DIAGNOSIS — D123 Benign neoplasm of transverse colon: Secondary | ICD-10-CM | POA: Diagnosis not present

## 2023-03-06 DIAGNOSIS — D123 Benign neoplasm of transverse colon: Secondary | ICD-10-CM | POA: Diagnosis not present

## 2023-03-06 DIAGNOSIS — D120 Benign neoplasm of cecum: Secondary | ICD-10-CM | POA: Diagnosis not present

## 2023-03-06 DIAGNOSIS — D125 Benign neoplasm of sigmoid colon: Secondary | ICD-10-CM | POA: Diagnosis not present

## 2023-07-07 DIAGNOSIS — F419 Anxiety disorder, unspecified: Secondary | ICD-10-CM | POA: Diagnosis not present

## 2023-07-07 DIAGNOSIS — I10 Essential (primary) hypertension: Secondary | ICD-10-CM | POA: Diagnosis not present

## 2023-07-07 DIAGNOSIS — M810 Age-related osteoporosis without current pathological fracture: Secondary | ICD-10-CM | POA: Diagnosis not present

## 2023-07-07 DIAGNOSIS — I7090 Unspecified atherosclerosis: Secondary | ICD-10-CM | POA: Diagnosis not present

## 2023-07-07 DIAGNOSIS — E785 Hyperlipidemia, unspecified: Secondary | ICD-10-CM | POA: Diagnosis not present

## 2023-07-07 DIAGNOSIS — R0989 Other specified symptoms and signs involving the circulatory and respiratory systems: Secondary | ICD-10-CM | POA: Diagnosis not present

## 2023-08-08 DIAGNOSIS — M810 Age-related osteoporosis without current pathological fracture: Secondary | ICD-10-CM | POA: Diagnosis not present

## 2023-08-30 DIAGNOSIS — R0989 Other specified symptoms and signs involving the circulatory and respiratory systems: Secondary | ICD-10-CM | POA: Diagnosis not present

## 2023-08-30 DIAGNOSIS — I771 Stricture of artery: Secondary | ICD-10-CM | POA: Diagnosis not present

## 2023-08-30 DIAGNOSIS — I6522 Occlusion and stenosis of left carotid artery: Secondary | ICD-10-CM | POA: Diagnosis not present

## 2023-09-29 ENCOUNTER — Encounter: Payer: Self-pay | Admitting: Advanced Practice Midwife

## 2023-10-18 DIAGNOSIS — M25561 Pain in right knee: Secondary | ICD-10-CM | POA: Diagnosis not present

## 2023-10-19 DIAGNOSIS — M65331 Trigger finger, right middle finger: Secondary | ICD-10-CM | POA: Diagnosis not present

## 2023-11-15 DIAGNOSIS — M25561 Pain in right knee: Secondary | ICD-10-CM | POA: Diagnosis not present

## 2023-12-05 DIAGNOSIS — M25561 Pain in right knee: Secondary | ICD-10-CM | POA: Diagnosis not present

## 2023-12-15 DIAGNOSIS — S83241A Other tear of medial meniscus, current injury, right knee, initial encounter: Secondary | ICD-10-CM | POA: Diagnosis not present

## 2023-12-15 DIAGNOSIS — Z1231 Encounter for screening mammogram for malignant neoplasm of breast: Secondary | ICD-10-CM | POA: Diagnosis not present

## 2024-01-19 DIAGNOSIS — M810 Age-related osteoporosis without current pathological fracture: Secondary | ICD-10-CM | POA: Diagnosis not present

## 2024-01-19 DIAGNOSIS — R109 Unspecified abdominal pain: Secondary | ICD-10-CM | POA: Diagnosis not present

## 2024-01-19 DIAGNOSIS — R3915 Urgency of urination: Secondary | ICD-10-CM | POA: Diagnosis not present

## 2024-01-19 DIAGNOSIS — Z860100 Personal history of colon polyps, unspecified: Secondary | ICD-10-CM | POA: Diagnosis not present

## 2024-01-19 DIAGNOSIS — L900 Lichen sclerosus et atrophicus: Secondary | ICD-10-CM | POA: Diagnosis not present

## 2024-01-19 DIAGNOSIS — E559 Vitamin D deficiency, unspecified: Secondary | ICD-10-CM | POA: Diagnosis not present

## 2024-01-19 DIAGNOSIS — F419 Anxiety disorder, unspecified: Secondary | ICD-10-CM | POA: Diagnosis not present

## 2024-01-19 DIAGNOSIS — I10 Essential (primary) hypertension: Secondary | ICD-10-CM | POA: Diagnosis not present

## 2024-01-19 DIAGNOSIS — E785 Hyperlipidemia, unspecified: Secondary | ICD-10-CM | POA: Diagnosis not present

## 2024-01-19 DIAGNOSIS — E039 Hypothyroidism, unspecified: Secondary | ICD-10-CM | POA: Diagnosis not present

## 2024-01-19 DIAGNOSIS — I7090 Unspecified atherosclerosis: Secondary | ICD-10-CM | POA: Diagnosis not present

## 2024-01-19 DIAGNOSIS — Z0000 Encounter for general adult medical examination without abnormal findings: Secondary | ICD-10-CM | POA: Diagnosis not present

## 2024-01-23 DIAGNOSIS — M810 Age-related osteoporosis without current pathological fracture: Secondary | ICD-10-CM | POA: Diagnosis not present

## 2024-02-15 DIAGNOSIS — M8580 Other specified disorders of bone density and structure, unspecified site: Secondary | ICD-10-CM | POA: Diagnosis not present
